# Patient Record
Sex: Female | Born: 1985 | Race: White | Hispanic: No | Marital: Married | Smoking: Never smoker
Health system: Southern US, Community
[De-identification: ages and names within clinical notes are randomized; demographics above are authoritative.]

## PROBLEM LIST (undated history)

## (undated) ENCOUNTER — Inpatient Hospital Stay (HOSPITAL_COMMUNITY): Payer: Self-pay

## (undated) DIAGNOSIS — Z789 Other specified health status: Secondary | ICD-10-CM

## (undated) HISTORY — DX: Other specified health status: Z78.9

## (undated) HISTORY — PX: NO PAST SURGERIES: SHX2092

## (undated) HISTORY — PX: BREAST SURGERY: SHX581

---

## 2012-08-02 LAB — OB RESULTS CONSOLE GC/CHLAMYDIA
Chlamydia: NEGATIVE
Gonorrhea: NEGATIVE

## 2012-08-02 LAB — OB RESULTS CONSOLE ABO/RH

## 2012-08-02 LAB — OB RESULTS CONSOLE ANTIBODY SCREEN: Antibody Screen: NEGATIVE

## 2013-02-25 ENCOUNTER — Telehealth (HOSPITAL_COMMUNITY): Payer: Self-pay | Admitting: *Deleted

## 2013-02-25 ENCOUNTER — Inpatient Hospital Stay (HOSPITAL_COMMUNITY)
Admission: AD | Admit: 2013-02-25 | Discharge: 2013-03-01 | DRG: 765 | Disposition: A | Discharge status: Home / Self Care | Payer: Commercial Managed Care - PPO | Source: Ambulatory Visit | Attending: Obstetrics and Gynecology | Admitting: Obstetrics and Gynecology

## 2013-02-25 ENCOUNTER — Encounter (HOSPITAL_COMMUNITY): Payer: Self-pay | Admitting: *Deleted

## 2013-02-25 DIAGNOSIS — O339 Maternal care for disproportion, unspecified: Secondary | ICD-10-CM | POA: Diagnosis present

## 2013-02-25 DIAGNOSIS — O411090 Infection of amniotic sac and membranes, unspecified, unspecified trimester, not applicable or unspecified: Secondary | ICD-10-CM | POA: Diagnosis present

## 2013-02-25 DIAGNOSIS — Z98891 History of uterine scar from previous surgery: Secondary | ICD-10-CM

## 2013-02-25 DIAGNOSIS — O330 Maternal care for disproportion due to deformity of maternal pelvic bones: Principal | ICD-10-CM | POA: Diagnosis present

## 2013-02-25 LAB — CBC
HCT: 33.5 % — ABNORMAL LOW (ref 36.0–46.0)
Hemoglobin: 11.1 g/dL — ABNORMAL LOW (ref 12.0–15.0)
MCHC: 33.1 g/dL (ref 30.0–36.0)
RBC: 4.08 MIL/uL (ref 3.87–5.11)

## 2013-02-25 MED ORDER — OXYTOCIN 40 UNITS IN LACTATED RINGERS INFUSION - SIMPLE MED: 62.5000 mL/h | INTRAVENOUS | Status: DC

## 2013-02-25 MED ORDER — ONDANSETRON HCL 4 MG/2ML IJ SOLN
4.0000 mg | Freq: Four times a day (QID) | INTRAMUSCULAR | Status: DC | PRN
  Administered 2013-02-26 (×2): 4 mg via INTRAVENOUS
  Filled 2013-02-25 (×2): qty 2

## 2013-02-25 MED ORDER — CITRIC ACID-SODIUM CITRATE 334-500 MG/5ML PO SOLN
30.0000 mL | ORAL | Status: DC | PRN
  Administered 2013-02-26 – 2013-02-27 (×2): 30 mL via ORAL
  Filled 2013-02-25 (×2): qty 15

## 2013-02-25 MED ORDER — OXYTOCIN BOLUS FROM INFUSION: 500.0000 mL | INTRAVENOUS | Status: DC

## 2013-02-25 MED ORDER — LACTATED RINGERS IV SOLN
INTRAVENOUS | Status: DC
  Administered 2013-02-25: 23:00:00 via INTRAVENOUS
  Administered 2013-02-26: 125 mL/h via INTRAVENOUS
  Administered 2013-02-26: 15:00:00 via INTRAVENOUS

## 2013-02-25 MED ORDER — BUTORPHANOL TARTRATE 1 MG/ML IJ SOLN
1.0000 mg | INTRAMUSCULAR | Status: DC | PRN
  Administered 2013-02-26 (×2): 1 mg via INTRAVENOUS
  Filled 2013-02-25 (×3): qty 1

## 2013-02-25 MED ORDER — IBUPROFEN 600 MG PO TABS: 600.0000 mg | ORAL_TABLET | Freq: Four times a day (QID) | ORAL | Status: DC | PRN

## 2013-02-25 MED ORDER — LIDOCAINE HCL (PF) 1 % IJ SOLN
30.0000 mL | INTRAMUSCULAR | Status: DC | PRN
  Filled 2013-02-25: qty 30

## 2013-02-25 MED ORDER — TERBUTALINE SULFATE 1 MG/ML IJ SOLN: 0.2500 mg | Freq: Once | INTRAMUSCULAR | Status: AC | PRN

## 2013-02-25 MED ORDER — OXYCODONE-ACETAMINOPHEN 5-325 MG PO TABS: 1.0000 | ORAL_TABLET | ORAL | Status: DC | PRN

## 2013-02-25 MED ORDER — OXYTOCIN 40 UNITS IN LACTATED RINGERS INFUSION - SIMPLE MED
1.0000 m[IU]/min | INTRAVENOUS | Status: DC
  Administered 2013-02-25: 1 m[IU]/min via INTRAVENOUS
  Administered 2013-02-26: 4 m[IU]/min via INTRAVENOUS
  Administered 2013-02-26: 10 m[IU]/min via INTRAVENOUS
  Administered 2013-02-26: 3 m[IU]/min via INTRAVENOUS
  Administered 2013-02-26: 13 m[IU]/min via INTRAVENOUS
  Administered 2013-02-26: 2 m[IU]/min via INTRAVENOUS
  Administered 2013-02-26: 8 m[IU]/min via INTRAVENOUS
  Administered 2013-02-26: 11 m[IU]/min via INTRAVENOUS
  Administered 2013-02-26: 9 m[IU]/min via INTRAVENOUS
  Administered 2013-02-26: 12 m[IU]/min via INTRAVENOUS
  Administered 2013-02-26: 7 m[IU]/min via INTRAVENOUS
  Administered 2013-02-26: 15 m[IU]/min via INTRAVENOUS
  Administered 2013-02-26: 5 m[IU]/min via INTRAVENOUS
  Administered 2013-02-26: 6 m[IU]/min via INTRAVENOUS
  Filled 2013-02-25: qty 1000

## 2013-02-25 MED ORDER — LACTATED RINGERS IV SOLN
500.0000 mL | INTRAVENOUS | Status: DC | PRN
  Administered 2013-02-26: 500 mL via INTRAVENOUS

## 2013-02-25 MED ORDER — ACETAMINOPHEN 325 MG PO TABS: 650.0000 mg | ORAL_TABLET | ORAL | Status: DC | PRN

## 2013-02-25 NOTE — Telephone Encounter (Signed)
Preadmission screen  
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
## 2013-02-26 ENCOUNTER — Encounter (HOSPITAL_COMMUNITY): Payer: Self-pay | Admitting: Anesthesiology

## 2013-02-26 LAB — RPR: RPR Ser Ql: NONREACTIVE

## 2013-02-26 MED ORDER — EPHEDRINE 5 MG/ML INJ
10.0000 mg | INTRAVENOUS | Status: DC | PRN
  Filled 2013-02-26: qty 4

## 2013-02-26 MED ORDER — FENTANYL 2.5 MCG/ML BUPIVACAINE 1/10 % EPIDURAL INFUSION (WH - ANES)
14.0000 mL/h | INTRAMUSCULAR | Status: DC | PRN
  Administered 2013-02-26 – 2013-02-27 (×2): 14 mL/h via EPIDURAL
  Filled 2013-02-26 (×4): qty 125

## 2013-02-26 MED ORDER — PROMETHAZINE HCL 25 MG/ML IJ SOLN: 12.5000 mg | Freq: Once | INTRAMUSCULAR | Status: DC

## 2013-02-26 MED ORDER — FENTANYL 2.5 MCG/ML BUPIVACAINE 1/10 % EPIDURAL INFUSION (WH - ANES)
INTRAMUSCULAR | Status: DC | PRN
  Administered 2013-02-26: 14 mL/h via EPIDURAL

## 2013-02-26 MED ORDER — LACTATED RINGERS IV SOLN
500.0000 mL | Freq: Once | INTRAVENOUS | Status: AC
  Administered 2013-02-26: 500 mL via INTRAVENOUS

## 2013-02-26 MED ORDER — LIDOCAINE HCL (PF) 1 % IJ SOLN
INTRAMUSCULAR | Status: DC | PRN
  Administered 2013-02-26 (×2): 8 mL

## 2013-02-26 MED ORDER — PHENYLEPHRINE 40 MCG/ML (10ML) SYRINGE FOR IV PUSH (FOR BLOOD PRESSURE SUPPORT)
80.0000 ug | PREFILLED_SYRINGE | INTRAVENOUS | Status: DC | PRN
  Filled 2013-02-26: qty 5

## 2013-02-26 MED ORDER — DIPHENHYDRAMINE HCL 50 MG/ML IJ SOLN: 12.5000 mg | INTRAMUSCULAR | Status: DC | PRN

## 2013-02-26 MED ORDER — PHENYLEPHRINE 40 MCG/ML (10ML) SYRINGE FOR IV PUSH (FOR BLOOD PRESSURE SUPPORT): 80.0000 ug | PREFILLED_SYRINGE | INTRAVENOUS | Status: DC | PRN

## 2013-02-26 MED ORDER — EPHEDRINE 5 MG/ML INJ: 10.0000 mg | INTRAVENOUS | Status: DC | PRN

## 2013-02-26 NOTE — Progress Notes (Signed)
Patient ID: Julia Bell, female   DOB: [birth year], 27 y.o.   MRN: [id number] Pitocin is now at 13 mu/ minute . Her contractions are 2 to 5 minutes apart. Per the RN the cervix was 4 cm at 2PM and 5 cm at 4 PM. She was 6 cm at 5 PM and now she is 6 cm The vertex is at - 2/3 station.

## 2013-02-26 NOTE — Progress Notes (Signed)
Patient ID: Julia Bell, female   DOB: [birth year], 27 y.o.   MRN: [id number] Pitocin at 10 mu/ minute and the contractions are q 2-3 minutes. At 11 AM per the RN exam the cervix was 3 cm 80 % effaced and the vertex was at -1/-2 station

## 2013-02-26 NOTE — Anesthesia Procedure Notes (Signed)
Epidural Patient location during procedure: OB Start time: 02/26/2013 6:49 AM End time: 02/26/2013 6:53 AM  Staffing Anesthesiologist: Sandrea Hughs Performed by: anesthesiologist   Preanesthetic Checklist Completed: patient identified, site marked, surgical consent, pre-op evaluation, timeout performed, IV checked, risks and benefits discussed and monitors and equipment checked  Epidural Patient position: sitting Prep: site prepped and draped and DuraPrep Patient monitoring: continuous pulse ox and blood pressure Approach: midline Injection technique: LOR air  Needle:  Needle type: Tuohy  Needle gauge: 17 G Needle length: 9 cm and 9 Needle insertion depth: 4 cm Catheter type: closed end flexible Catheter size: 19 Gauge Catheter at skin depth: 9 cm Test dose: negative and Other  Assessment Sensory level: T9 Events: blood not aspirated, injection not painful, no injection resistance, negative IV test and no paresthesia  Additional Notes Reason for block:procedure for pain

## 2013-02-26 NOTE — Progress Notes (Signed)
Patient ID: Julia Bell, female   DOB: 1986/01/12, 27 y.o.   MRN: [id number] Pitocin is at 18 mu/ minute and the contractions are q 2 minutes. The cervix is 8-9 cm 100% effaced and the vertex is at -1/0 station.

## 2013-02-26 NOTE — Progress Notes (Signed)
Called dr Senaida Ores, informed of uc pattern, fhr tracing, sve, pitocin discontinued, to leave pitoicn off at this time and see what pt does on her own

## 2013-02-26 NOTE — Progress Notes (Signed)
Pt contractions continued throughout night with SROM at 300am.  Received stadol x 2, and then epidural at 0625am  Subjective: Pt received epidural and is comfortable.    Objective: BP 132/79  Pulse 79  Temp(Src) 98.1 F (36.7 C) (Oral)  Resp 20  Ht 5' 2.5" (1.588 m)  Wt 72.576 kg (160 lb)  BMI 28.78 kg/m2  SpO2 99%  LMP 05/12/2012      FHT:  FHR: 145 bpm, variability: moderate,  accelerations:  Present,  decelerations:  Absent UC:   regular, every 2-3 minutes SVE:   Dilation: 2 Effacement (%): 80 Station: -1 Exam by:: Dr. Senaida Ores  IUPC placed  Labs: Lab Results  Component Value Date   WBC 11.3* 02/25/2013   HGB 11.1* 02/25/2013   HCT 33.5* 02/25/2013   MCV 82.1 02/25/2013   PLT 206 02/25/2013    Assessment / Plan: Pt still likely in latent phase labor. Contractions do not look adequate so will continue to increase pitocin. Oliver Pila 02/26/2013, 8:24 AM

## 2013-02-26 NOTE — Progress Notes (Signed)
Patient ID: Julia Bell, female   DOB: 1986-09-04, 27 y.o.   MRN: [id number] Pt became fully dilated at 10:30 PM and has been pushing since then. I will observe her progress.

## 2013-02-26 NOTE — Anesthesia Preprocedure Evaluation (Signed)
Anesthesia Evaluation  Patient identified by MRN, date of birth, ID band Patient awake    Reviewed: Allergy & Precautions, H&P , NPO status , Patient's Chart, lab work & pertinent test results  Airway Mallampati: II TM Distance: >3 FB Neck ROM: full    Dental no notable dental hx.    Pulmonary neg pulmonary ROS,    Pulmonary exam normal       Cardiovascular negative cardio ROS      Neuro/Psych negative neurological ROS  negative psych ROS   GI/Hepatic negative GI ROS, Neg liver ROS,   Endo/Other  negative endocrine ROS  Renal/GU negative Renal ROS     Musculoskeletal negative musculoskeletal ROS (+)   Abdominal Normal abdominal exam  (+)   Peds negative pediatric ROS (+)  Hematology negative hematology ROS (+)   Anesthesia Other Findings   Reproductive/Obstetrics (+) Pregnancy                           Anesthesia Physical Anesthesia Plan  ASA: II  Anesthesia Plan: Epidural   Post-op Pain Management:    Induction:   Airway Management Planned:   Additional Equipment:   Intra-op Plan:   Post-operative Plan:   Informed Consent: I have reviewed the patients History and Physical, chart, labs and discussed the procedure including the risks, benefits and alternatives for the proposed anesthesia with the patient or authorized representative who has indicated his/her understanding and acceptance.     Plan Discussed with:   Anesthesia Plan Comments:         Anesthesia Quick Evaluation

## 2013-02-27 ENCOUNTER — Encounter (HOSPITAL_COMMUNITY): Payer: Self-pay | Admitting: Anesthesiology

## 2013-02-27 ENCOUNTER — Encounter (HOSPITAL_COMMUNITY): Payer: Self-pay | Admitting: *Deleted

## 2013-02-27 ENCOUNTER — Inpatient Hospital Stay (HOSPITAL_COMMUNITY): Payer: Commercial Managed Care - PPO | Admitting: Anesthesiology

## 2013-02-27 ENCOUNTER — Encounter (HOSPITAL_COMMUNITY)
Admission: AD | Disposition: A | Discharge status: Home / Self Care | Payer: Self-pay | Source: Ambulatory Visit | Attending: Obstetrics and Gynecology

## 2013-02-27 LAB — ABO/RH: ABO/RH(D): O POS

## 2013-02-27 LAB — CBC
HCT: 25.4 % — ABNORMAL LOW (ref 36.0–46.0)
Hemoglobin: 8.3 g/dL — ABNORMAL LOW (ref 12.0–15.0)
MCHC: 32.7 g/dL (ref 30.0–36.0)

## 2013-02-27 SURGERY — Surgical Case
Anesthesia: Epidural | Site: Abdomen | Wound class: Clean Contaminated

## 2013-02-27 MED ORDER — LACTATED RINGERS IV SOLN
INTRAVENOUS | Status: DC | PRN
  Administered 2013-02-27 (×2): via INTRAVENOUS

## 2013-02-27 MED ORDER — PHENYLEPHRINE 40 MCG/ML (10ML) SYRINGE FOR IV PUSH (FOR BLOOD PRESSURE SUPPORT)
PREFILLED_SYRINGE | INTRAVENOUS | Status: AC
  Filled 2013-02-27: qty 5

## 2013-02-27 MED ORDER — KETOROLAC TROMETHAMINE 30 MG/ML IJ SOLN: 15.0000 mg | Freq: Once | INTRAMUSCULAR | Status: DC | PRN

## 2013-02-27 MED ORDER — TETANUS-DIPHTH-ACELL PERTUSSIS 5-2.5-18.5 LF-MCG/0.5 IM SUSP: 0.5000 mL | Freq: Once | INTRAMUSCULAR | Status: DC

## 2013-02-27 MED ORDER — NALBUPHINE SYRINGE 5 MG/0.5 ML
5.0000 mg | INJECTION | INTRAMUSCULAR | Status: DC | PRN
  Filled 2013-02-27: qty 1

## 2013-02-27 MED ORDER — WITCH HAZEL-GLYCERIN EX PADS: 1.0000 "application " | MEDICATED_PAD | CUTANEOUS | Status: DC | PRN

## 2013-02-27 MED ORDER — SODIUM BICARBONATE 8.4 % IV SOLN
INTRAVENOUS | Status: AC
  Filled 2013-02-27: qty 50

## 2013-02-27 MED ORDER — LACTATED RINGERS IV SOLN
INTRAVENOUS | Status: AC
  Administered 2013-02-27 (×2): via INTRAVENOUS

## 2013-02-27 MED ORDER — SENNOSIDES-DOCUSATE SODIUM 8.6-50 MG PO TABS
2.0000 | ORAL_TABLET | Freq: Every day | ORAL | Status: DC
  Administered 2013-02-28: 2 via ORAL

## 2013-02-27 MED ORDER — METOCLOPRAMIDE HCL 5 MG/ML IJ SOLN: 10.0000 mg | Freq: Three times a day (TID) | INTRAMUSCULAR | Status: DC | PRN

## 2013-02-27 MED ORDER — ZOLPIDEM TARTRATE 5 MG PO TABS: 5.0000 mg | ORAL_TABLET | Freq: Every evening | ORAL | Status: DC | PRN

## 2013-02-27 MED ORDER — SCOPOLAMINE 1 MG/3DAYS TD PT72
MEDICATED_PATCH | TRANSDERMAL | Status: AC
  Filled 2013-02-27: qty 1

## 2013-02-27 MED ORDER — LIDOCAINE HCL (CARDIAC) 20 MG/ML IV SOLN
INTRAVENOUS | Status: AC
  Filled 2013-02-27: qty 5

## 2013-02-27 MED ORDER — NALOXONE HCL 0.4 MG/ML IJ SOLN: 0.4000 mg | INTRAMUSCULAR | Status: DC | PRN

## 2013-02-27 MED ORDER — EPHEDRINE 5 MG/ML INJ
INTRAVENOUS | Status: AC
  Filled 2013-02-27: qty 10

## 2013-02-27 MED ORDER — MEPERIDINE HCL 25 MG/ML IJ SOLN: 6.2500 mg | INTRAMUSCULAR | Status: DC | PRN

## 2013-02-27 MED ORDER — MEPERIDINE HCL 25 MG/ML IJ SOLN
INTRAMUSCULAR | Status: DC | PRN
  Administered 2013-02-27: 25 mg via INTRAVENOUS

## 2013-02-27 MED ORDER — DIBUCAINE 1 % RE OINT: 1.0000 "application " | TOPICAL_OINTMENT | RECTAL | Status: DC | PRN

## 2013-02-27 MED ORDER — MEASLES, MUMPS & RUBELLA VAC ~~LOC~~ INJ
0.5000 mL | INJECTION | Freq: Once | SUBCUTANEOUS | Status: AC
  Administered 2013-03-01: 0.5 mL via SUBCUTANEOUS
  Filled 2013-02-27 (×2): qty 0.5

## 2013-02-27 MED ORDER — MORPHINE SULFATE (PF) 0.5 MG/ML IJ SOLN
INTRAMUSCULAR | Status: DC | PRN
  Administered 2013-02-27: 2000 ug via INTRAVENOUS
  Administered 2013-02-27: 3000 ug via EPIDURAL

## 2013-02-27 MED ORDER — HYDROMORPHONE HCL PF 1 MG/ML IJ SOLN
INTRAMUSCULAR | Status: AC
  Filled 2013-02-27: qty 1

## 2013-02-27 MED ORDER — DIPHENHYDRAMINE HCL 50 MG/ML IJ SOLN: 12.5000 mg | INTRAMUSCULAR | Status: DC | PRN

## 2013-02-27 MED ORDER — DIPHENHYDRAMINE HCL 50 MG/ML IJ SOLN: 25.0000 mg | INTRAMUSCULAR | Status: DC | PRN

## 2013-02-27 MED ORDER — SIMETHICONE 80 MG PO CHEW
80.0000 mg | CHEWABLE_TABLET | Freq: Three times a day (TID) | ORAL | Status: DC
  Administered 2013-02-27 – 2013-03-01 (×7): 80 mg via ORAL

## 2013-02-27 MED ORDER — ONDANSETRON HCL 4 MG/2ML IJ SOLN
INTRAMUSCULAR | Status: AC
  Filled 2013-02-27: qty 2

## 2013-02-27 MED ORDER — MEPERIDINE HCL 25 MG/ML IJ SOLN
INTRAMUSCULAR | Status: AC
  Filled 2013-02-27: qty 1

## 2013-02-27 MED ORDER — SCOPOLAMINE 1 MG/3DAYS TD PT72
1.0000 | MEDICATED_PATCH | Freq: Once | TRANSDERMAL | Status: DC
  Administered 2013-02-27: 1.5 mg via TRANSDERMAL

## 2013-02-27 MED ORDER — PHENYLEPHRINE HCL 10 MG/ML IJ SOLN
INTRAMUSCULAR | Status: DC | PRN
  Administered 2013-02-27: 80 ug via INTRAVENOUS

## 2013-02-27 MED ORDER — SODIUM BICARBONATE 8.4 % IV SOLN
INTRAVENOUS | Status: DC | PRN
  Administered 2013-02-27: 5 mL via EPIDURAL

## 2013-02-27 MED ORDER — KETOROLAC TROMETHAMINE 60 MG/2ML IM SOLN
INTRAMUSCULAR | Status: AC
  Filled 2013-02-27: qty 2

## 2013-02-27 MED ORDER — CEFAZOLIN SODIUM 1-5 GM-% IV SOLN
1.0000 g | Freq: Three times a day (TID) | INTRAVENOUS | Status: AC
  Administered 2013-02-27 (×3): 1 g via INTRAVENOUS
  Filled 2013-02-27 (×3): qty 50

## 2013-02-27 MED ORDER — CEFAZOLIN SODIUM-DEXTROSE 2-3 GM-% IV SOLR
2.0000 g | Freq: Once | INTRAVENOUS | Status: AC
  Administered 2013-02-27: 2 g via INTRAVENOUS
  Filled 2013-02-27: qty 50

## 2013-02-27 MED ORDER — OXYTOCIN 40 UNITS IN LACTATED RINGERS INFUSION - SIMPLE MED: 62.5000 mL/h | INTRAVENOUS | Status: AC

## 2013-02-27 MED ORDER — MENTHOL 3 MG MT LOZG: 1.0000 | LOZENGE | OROMUCOSAL | Status: DC | PRN

## 2013-02-27 MED ORDER — ONDANSETRON HCL 4 MG PO TABS: 4.0000 mg | ORAL_TABLET | ORAL | Status: DC | PRN

## 2013-02-27 MED ORDER — ONDANSETRON HCL 4 MG/2ML IJ SOLN: 4.0000 mg | INTRAMUSCULAR | Status: DC | PRN

## 2013-02-27 MED ORDER — HYDROMORPHONE HCL PF 1 MG/ML IJ SOLN
0.2500 mg | INTRAMUSCULAR | Status: DC | PRN
  Administered 2013-02-27 (×2): 0.5 mg via INTRAVENOUS

## 2013-02-27 MED ORDER — SIMETHICONE 80 MG PO CHEW
80.0000 mg | CHEWABLE_TABLET | ORAL | Status: DC | PRN
  Administered 2013-02-28: 80 mg via ORAL

## 2013-02-27 MED ORDER — KETOROLAC TROMETHAMINE 60 MG/2ML IM SOLN
60.0000 mg | Freq: Once | INTRAMUSCULAR | Status: AC | PRN
  Administered 2013-02-27: 60 mg via INTRAMUSCULAR

## 2013-02-27 MED ORDER — DIPHENHYDRAMINE HCL 25 MG PO CAPS: 25.0000 mg | ORAL_CAPSULE | Freq: Four times a day (QID) | ORAL | Status: DC | PRN

## 2013-02-27 MED ORDER — SODIUM CHLORIDE 0.9 % IJ SOLN
3.0000 mL | INTRAMUSCULAR | Status: DC | PRN
  Administered 2013-02-27: 3 mL via INTRAVENOUS

## 2013-02-27 MED ORDER — PRENATAL MULTIVITAMIN CH
1.0000 | ORAL_TABLET | Freq: Every day | ORAL | Status: DC
  Administered 2013-02-27: 1 via ORAL
  Filled 2013-02-27: qty 1

## 2013-02-27 MED ORDER — OXYCODONE-ACETAMINOPHEN 5-325 MG PO TABS
1.0000 | ORAL_TABLET | ORAL | Status: DC | PRN
  Administered 2013-02-28 – 2013-03-01 (×4): 1 via ORAL
  Filled 2013-02-27 (×4): qty 1

## 2013-02-27 MED ORDER — OXYTOCIN 10 UNIT/ML IJ SOLN
40.0000 [IU] | INTRAVENOUS | Status: DC | PRN
  Administered 2013-02-27: 40 [IU] via INTRAVENOUS

## 2013-02-27 MED ORDER — OXYTOCIN 10 UNIT/ML IJ SOLN
INTRAMUSCULAR | Status: AC
  Filled 2013-02-27: qty 4

## 2013-02-27 MED ORDER — DIPHENHYDRAMINE HCL 25 MG PO CAPS: 25.0000 mg | ORAL_CAPSULE | ORAL | Status: DC | PRN

## 2013-02-27 MED ORDER — 0.9 % SODIUM CHLORIDE (POUR BTL) OPTIME
TOPICAL | Status: DC | PRN
  Administered 2013-02-27: 1000 mL

## 2013-02-27 MED ORDER — ONDANSETRON HCL 4 MG/2ML IJ SOLN
INTRAMUSCULAR | Status: DC | PRN
  Administered 2013-02-27: 4 mg via INTRAVENOUS

## 2013-02-27 MED ORDER — NALOXONE HCL 1 MG/ML IJ SOLN
1.0000 ug/kg/h | INTRAVENOUS | Status: DC | PRN
  Filled 2013-02-27: qty 2

## 2013-02-27 MED ORDER — ONDANSETRON HCL 4 MG/2ML IJ SOLN: 4.0000 mg | Freq: Three times a day (TID) | INTRAMUSCULAR | Status: DC | PRN

## 2013-02-27 MED ORDER — MORPHINE SULFATE 0.5 MG/ML IJ SOLN
INTRAMUSCULAR | Status: AC
  Filled 2013-02-27: qty 10

## 2013-02-27 MED ORDER — NALBUPHINE SYRINGE 5 MG/0.5 ML
5.0000 mg | INJECTION | INTRAMUSCULAR | Status: DC | PRN
  Administered 2013-02-27: 10 mg via INTRAVENOUS
  Filled 2013-02-27 (×2): qty 1

## 2013-02-27 MED ORDER — KETOROLAC TROMETHAMINE 30 MG/ML IJ SOLN
30.0000 mg | Freq: Four times a day (QID) | INTRAMUSCULAR | Status: AC | PRN
  Filled 2013-02-27: qty 1

## 2013-02-27 MED ORDER — KETOROLAC TROMETHAMINE 30 MG/ML IJ SOLN: 30.0000 mg | Freq: Four times a day (QID) | INTRAMUSCULAR | Status: AC | PRN

## 2013-02-27 MED ORDER — LANOLIN HYDROUS EX OINT: 1.0000 "application " | TOPICAL_OINTMENT | CUTANEOUS | Status: DC | PRN

## 2013-02-27 MED ORDER — IBUPROFEN 600 MG PO TABS
600.0000 mg | ORAL_TABLET | Freq: Four times a day (QID) | ORAL | Status: DC
  Administered 2013-02-27 – 2013-03-01 (×7): 600 mg via ORAL
  Filled 2013-02-27 (×8): qty 1

## 2013-02-27 MED ORDER — PROMETHAZINE HCL 25 MG/ML IJ SOLN: 6.2500 mg | INTRAMUSCULAR | Status: DC | PRN

## 2013-02-27 SURGICAL SUPPLY — 37 items
CLOTH BEACON ORANGE TIMEOUT ST (SAFETY) ×2 IMPLANT
CONTAINER PREFILL 10% NBF 15ML (MISCELLANEOUS) IMPLANT
DRAPE LG THREE QUARTER DISP (DRAPES) ×2 IMPLANT
DRSG OPSITE POSTOP 4X10 (GAUZE/BANDAGES/DRESSINGS) ×2 IMPLANT
DRSG VASELINE 3X18 (GAUZE/BANDAGES/DRESSINGS) ×2 IMPLANT
DURAPREP 26ML APPLICATOR (WOUND CARE) ×2 IMPLANT
ELECT REM PT RETURN 9FT ADLT (ELECTROSURGICAL) ×2
ELECTRODE REM PT RTRN 9FT ADLT (ELECTROSURGICAL) ×1 IMPLANT
EXTRACTOR VACUUM KIWI (MISCELLANEOUS) IMPLANT
EXTRACTOR VACUUM M CUP 4 TUBE (SUCTIONS) IMPLANT
GAUZE VASELINE 3X9 (GAUZE/BANDAGES/DRESSINGS) ×2 IMPLANT
GLOVE BIO SURGEON STRL SZ7.5 (GLOVE) ×2 IMPLANT
GLOVE INDICATOR 7.0 STRL GRN (GLOVE) ×2 IMPLANT
GLOVE SKINSENSE NS SZ7.5 (GLOVE) ×1
GLOVE SKINSENSE NS SZ8.0 LF (GLOVE) ×1
GLOVE SKINSENSE STRL SZ7.5 (GLOVE) ×1 IMPLANT
GLOVE SKINSENSE STRL SZ8.0 LF (GLOVE) ×1 IMPLANT
GOWN PREVENTION PLUS XLARGE (GOWN DISPOSABLE) ×2 IMPLANT
GOWN STRL REIN XL XLG (GOWN DISPOSABLE) ×4 IMPLANT
KIT ABG SYR 3ML LUER SLIP (SYRINGE) IMPLANT
NEEDLE HYPO 25X5/8 SAFETYGLIDE (NEEDLE) IMPLANT
NS IRRIG 1000ML POUR BTL (IV SOLUTION) ×2 IMPLANT
PACK C SECTION WH (CUSTOM PROCEDURE TRAY) ×2 IMPLANT
PAD OB MATERNITY 4.3X12.25 (PERSONAL CARE ITEMS) ×2 IMPLANT
RTRCTR C-SECT PINK 25CM LRG (MISCELLANEOUS) ×2 IMPLANT
SLEEVE SCD COMPRESS KNEE MED (MISCELLANEOUS) ×2 IMPLANT
STAPLER VISISTAT 35W (STAPLE) ×2 IMPLANT
SUT PLAIN 0 NONE (SUTURE) IMPLANT
SUT VIC AB 0 CT1 36 (SUTURE) ×12 IMPLANT
SUT VIC AB 3-0 CTX 36 (SUTURE) ×2 IMPLANT
SUT VIC AB 3-0 SH 27 (SUTURE)
SUT VIC AB 3-0 SH 27X BRD (SUTURE) IMPLANT
SUT VIC AB 4-0 KS 27 (SUTURE) IMPLANT
SUT VICRYL 0 TIES 12 18 (SUTURE) IMPLANT
TOWEL OR 17X24 6PK STRL BLUE (TOWEL DISPOSABLE) ×6 IMPLANT
TRAY FOLEY CATH 14FR (SET/KITS/TRAYS/PACK) IMPLANT
WATER STERILE IRR 1000ML POUR (IV SOLUTION) IMPLANT

## 2013-02-27 NOTE — Anesthesia Postprocedure Evaluation (Signed)
Anesthesia Post Note  Patient: Julia Bell  Procedure(s) Performed: Procedure(s) (LRB): CESAREAN SECTION (N/A)  Anesthesia type: Epidural  Patient location: Mother/Baby  Post pain: Pain level controlled  Post assessment: Post-op Vital signs reviewed  Last Vitals:  Filed Vitals:   02/27/13 1210  BP: 128/77  Pulse: 77  Temp: 37.1 C  Resp: 18    Post vital signs: Reviewed  Level of consciousness:alert  Complications: No apparent anesthesia complications  

## 2013-02-27 NOTE — Progress Notes (Signed)
Patient ID: Julia Bell, female   DOB: Oct 24, 1986, 27 y.o.   MRN: [id number] After I left the room the pt told the nurse that she wanted to proceed with a C section. I spoke tyo the pt and she indeed does not want to keep pushing. Will proceed with c section for relative CPD and probable chorioamnionitis

## 2013-02-27 NOTE — Op Note (Signed)
Operative note on Julia Bell  Date of the operation 02/27/2013  Preoperative diagnosis: relative cephalopelvic disproportion, probable chorioamnionitis  Postoperative diagnosis: Same  Operation: Low transverse cervical C-section  Operator: Azalia Neuberger  Anesthesia: Epidural  Dr. Arby Barrette  The patient was brought to the operating room after her epidural was boosted and she was placed on the operating room table in the left lateral tilt position. After anesthesia was adequate the abdomen was prepped by the circulating nurse with DuraPrep. A timeout was done. The Foley catheter which was indwelling was draining slightly bloody urine. After a three-minute dry of the DuraPrep the abdomen was draped as a sterile field.A transverse incision was made through the skin subcutaneous tissue and fascia just above the pubis after anesthesia was found to be adequate. I entered the peritoneal cavity, after I separated the fascia from the rectus muscle superiorly and inferiorly. The peritoneal incision was enlarged and an Alexis retractor was placed into the peritoneal cavity to aid in exposure of the lower uterine segment. A superficial transverse incision was made through the myometrium in the lower uterine segment. I entered the  amniotic sac with my finger and enlarged the incision by pulling superiorly and inferiorly. The vertex was wedged into the pelvis and I asked the RN from labor and delivery to push up on the baby's head.This allowed me to deliver the baby's head atraumatically and after delivering the head the body came out quickly. I suction the baby's nose and mouth, the scrub nurse clamped and cut the cord, and I took the baby to the waiting neonatologist Dr. Mikle Bosworth. She assigned Apgars to the female infant of 7 at one and 9 at 5 minutes. Pitocin was begun and the placenta was removed. I teased out a large amount of membranes and palpated the fundus of the uterus to make sure there was no remaining products  of conception. The incision was seen to be an inch above the bladder and I closed the uterine incision transversely with 2 running sutures of 0 Vicryl locking the first layer and nonlocking of the second. The uterus tubes and ovaries were inspected when I had exteriorized the uterus shortly after delivery and they were normal. Both gutters were blotted free of blood, liberal irrigation confirmed hemostasis, the retractor was removed, and the abdominal wall was closed in layers. 0 Vicryl interrupted sutures were used to close the peritoneum and rectus muscle in one layer, 2 running sutures of 0 Vicryl were used for the fascia, a running 3-0 Vicryl for the subcutaneous tissue, and staples were  used for the skin.  Estimated blood loss was about cc sponge and needle counts were correct, a dressing was applied, and the patient was returned to recovery in satisfactory condition

## 2013-02-27 NOTE — Anesthesia Postprocedure Evaluation (Signed)
Anesthesia Post Note  Patient: Julia Bell  Procedure(s) Performed: Procedure(s) (LRB): CESAREAN SECTION (N/A)  Anesthesia type: Epidural  Patient location: Mother/Baby  Post pain: Pain level controlled  Post assessment: Post-op Vital signs reviewed  Last Vitals:  Filed Vitals:   02/27/13 1210  BP: 128/77  Pulse: 77  Temp: 37.1 C  Resp: 18    Post vital signs: Reviewed  Level of consciousness:alert  Complications: No apparent anesthesia complications

## 2013-02-27 NOTE — Anesthesia Postprocedure Evaluation (Signed)
Anesthesia Post Note  Patient: Julia Bell  Procedure(s) Performed: Procedure(s) (LRB): CESAREAN SECTION (N/A)  Anesthesia type: Epidural  Patient location: PACU  Post pain: Pain level controlled  Post assessment: Post-op Vital signs reviewed  Last Vitals:  Filed Vitals:   02/27/13 0151  BP:   Pulse:   Temp: 37 C  Resp:     Post vital signs: Reviewed  Level of consciousness: awake  Complications: No apparent anesthesia complications

## 2013-02-27 NOTE — Transfer of Care (Signed)
Immediate Anesthesia Transfer of Care Note  Patient: Julia Bell  Procedure(s) Performed: Procedure(s) with comments: CESAREAN SECTION (N/A) - Primary Cesarean Section Delivery Baby Girl @ 0101   Apgars 7/9  Patient Location: PACU  Anesthesia Type:Epidural  Level of Consciousness: awake, alert  and oriented  Airway & Oxygen Therapy: Patient Spontanous Breathing  Post-op Assessment: Report given to PACU RN and Post -op Vital signs reviewed and stable  Post vital signs: Reviewed and stable  Complications: No apparent anesthesia complications

## 2013-02-27 NOTE — Progress Notes (Signed)
Patient ID: Julia Bell, female   DOB: September 23, 1986, 27 y.o.   MRN: [id number] Dos afebrile doing well

## 2013-02-27 NOTE — Progress Notes (Signed)
Pt requesting to see Dr. Ambrose Mantle to discuss plan of care, Dr. Ambrose Mantle at bedside pt requesting to precede with c-section, Dr. Ambrose Mantle discussing risks and benefits of c-section, pt verbalizes understanding and consents to c-section, FOB at bedside

## 2013-02-27 NOTE — Progress Notes (Signed)
Patient ID: Julia Bell, female   DOB: 13-May-1986, 27 y.o.   MRN: [id number] Pt has been pushing for 1 and 1/2 hours. Temp = 100.3 degrees. I cannot see any of the caput with pushing. Will  Reevaluate in 30 minutes.

## 2013-02-28 ENCOUNTER — Encounter (HOSPITAL_COMMUNITY): Payer: Self-pay | Admitting: Obstetrics and Gynecology

## 2013-02-28 ENCOUNTER — Inpatient Hospital Stay (HOSPITAL_COMMUNITY): Admission: RE | Admit: 2013-02-28 | Payer: Commercial Managed Care - PPO | Source: Ambulatory Visit

## 2013-02-28 NOTE — Progress Notes (Signed)
Patient ID: Julia Bell, female   DOB: Aug 10, 1986, 27 y.o.   MRN: [id number] #1 afebrile The antibiotics have been d/c'ed. She is tolerating a diet, passing flatus, voiding well and ambulating well. HGB 11.1 to 8.3.

## 2013-03-01 MED ORDER — OXYCODONE-ACETAMINOPHEN 5-325 MG PO TABS: 1.0000 | ORAL_TABLET | Freq: Four times a day (QID) | ORAL | Status: DC | PRN

## 2013-03-01 MED ORDER — IBUPROFEN 600 MG PO TABS: 600.0000 mg | ORAL_TABLET | Freq: Four times a day (QID) | ORAL | Status: DC | PRN

## 2013-03-01 NOTE — Progress Notes (Signed)
Patient ID: Julia Bell, female   DOB: 04-Oct-1986, 27 y.o.   MRN: [id number] #2 afebrile BP normal no problems for d/c
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
## 2014-10-13 ENCOUNTER — Encounter (HOSPITAL_COMMUNITY): Payer: Self-pay | Admitting: Obstetrics and Gynecology

## 2015-06-07 ENCOUNTER — Inpatient Hospital Stay (HOSPITAL_COMMUNITY)
Admission: AD | Admit: 2015-06-07 | Discharge: 2015-06-07 | Disposition: A | Discharge status: Home / Self Care | Payer: PRIVATE HEALTH INSURANCE | Source: Ambulatory Visit | Attending: Obstetrics and Gynecology | Admitting: Obstetrics and Gynecology

## 2015-06-07 ENCOUNTER — Inpatient Hospital Stay (HOSPITAL_COMMUNITY): Payer: Commercial Managed Care - PPO

## 2015-06-07 ENCOUNTER — Encounter (HOSPITAL_COMMUNITY): Payer: Self-pay | Admitting: *Deleted

## 2015-06-07 DIAGNOSIS — O034 Incomplete spontaneous abortion without complication: Secondary | ICD-10-CM | POA: Insufficient documentation

## 2015-06-07 DIAGNOSIS — Z3A09 9 weeks gestation of pregnancy: Secondary | ICD-10-CM | POA: Insufficient documentation

## 2015-06-07 DIAGNOSIS — O209 Hemorrhage in early pregnancy, unspecified: Secondary | ICD-10-CM

## 2015-06-07 LAB — URINALYSIS, ROUTINE W REFLEX MICROSCOPIC
BILIRUBIN URINE: NEGATIVE
Glucose, UA: NEGATIVE mg/dL
Ketones, ur: NEGATIVE mg/dL
LEUKOCYTES UA: NEGATIVE
NITRITE: NEGATIVE
Protein, ur: NEGATIVE mg/dL
Specific Gravity, Urine: 1.02 (ref 1.005–1.030)
UROBILINOGEN UA: 0.2 mg/dL (ref 0.0–1.0)
pH: 7 (ref 5.0–8.0)

## 2015-06-07 LAB — URINE MICROSCOPIC-ADD ON

## 2015-06-07 LAB — POCT PREGNANCY, URINE: Preg Test, Ur: POSITIVE — AB

## 2015-06-07 NOTE — MAU Provider Note (Signed)
History     CSN: [id number]  Arrival date and time: 06/07/15 4098   None     Chief Complaint  Patient presents with  . Vaginal Bleeding   HPI 29 y.o. G2P1001 at [redacted]w[redacted]d w/ vaginal bleeding x 1 day. No pain. Last intercourse 3 days ago.   Past Medical History  Diagnosis Date  . Medical history non-contributory     Past Surgical History  Procedure Laterality Date  . No past surgeries    . Cesarean section N/A 02/27/2013    Procedure: CESAREAN SECTION;  Surgeon: Bing Plume, MD;  Location: [facility];  Service: Obstetrics;  Laterality: N/A;  Primary Cesarean Section Delivery Baby Girl @ 0101   Apgars 7/9    Family History  Problem Relation Age of Onset  . Diabetes Maternal Grandfather   . Heart attack Maternal Grandfather     History  Substance Use Topics  . Smoking status: Never Smoker   . Smokeless tobacco: Never Used  . Alcohol Use: No    Allergies: No Known Allergies  No prescriptions prior to admission    Review of Systems  Constitutional: Negative.   Respiratory: Negative.   Cardiovascular: Negative.   Gastrointestinal: Negative for nausea, vomiting, abdominal pain, diarrhea and constipation.  Genitourinary: Negative for dysuria, urgency, frequency, hematuria and flank pain.       + vaginal bleeding   Musculoskeletal: Negative.   Neurological: Negative.   Psychiatric/Behavioral: Negative.    Physical Exam   Blood pressure 122/67, pulse 73, temperature 97.8 F (36.6 C), temperature source Oral, resp. rate 20, last menstrual period 04/03/2015, unknown if currently breastfeeding.  Physical Exam  Nursing note and vitals reviewed. Constitutional: She is oriented to person, place, and time. She appears well-developed and well-nourished. No distress.  Cardiovascular: Normal rate.   Respiratory: Effort normal.  GI: Soft. She exhibits no mass. There is no tenderness. There is no rebound and no guarding.  Genitourinary: There is no rash, tenderness or lesion  on the right labia. There is no rash, tenderness or lesion on the left labia. Uterus is enlarged (c/w dates). Uterus is not tender. Cervix exhibits no motion tenderness, no discharge and no friability. Right adnexum displays no mass, no tenderness and no fullness. Left adnexum displays no mass, no tenderness and no fullness. There is bleeding (small) in the vagina. No vaginal discharge found.  Cervix closed   Musculoskeletal: Normal range of motion.  Neurological: She is alert and oriented to person, place, and time.  Skin: Skin is warm and dry.  Psychiatric: She has a normal mood and affect.    MAU Course  Procedures  Results for orders placed or performed during the hospital encounter of 06/07/15 (from the past 24 hour(s))  Urinalysis, Routine w reflex microscopic (not at [facility])     Status: Abnormal   Collection Time: 06/07/15  7:10 AM  Result Value Ref Range   Color, Urine YELLOW YELLOW   APPearance CLEAR CLEAR   Specific Gravity, Urine 1.020 1.005 - 1.030   pH 7.0 5.0 - 8.0   Glucose, UA NEGATIVE NEGATIVE mg/dL   Hgb urine dipstick LARGE (A) NEGATIVE   Bilirubin Urine NEGATIVE NEGATIVE   Ketones, ur NEGATIVE NEGATIVE mg/dL   Protein, ur NEGATIVE NEGATIVE mg/dL   Urobilinogen, UA 0.2 0.0 - 1.0 mg/dL   Nitrite NEGATIVE NEGATIVE   Leukocytes, UA NEGATIVE NEGATIVE  Urine microscopic-add on     Status: Abnormal   Collection Time: 06/07/15  7:10 AM  Result Value  Ref Range   Squamous Epithelial / LPF MANY (A) RARE   WBC, UA 3-6 <3 WBC/hpf   RBC / HPF 3-6 <3 RBC/hpf   Bacteria, UA RARE RARE   Urine-Other MUCOUS PRESENT   Pregnancy, urine POC     Status: Abnormal   Collection Time: 06/07/15  9:19 AM  Result Value Ref Range   Preg Test, Ur POSITIVE (A) NEGATIVE   US Ob Comp Less 14 Wks  06/07/2015   CLINICAL DATA:  Vaginal bleeding. Estimated gestational age [redacted] weeks 2 days per LMP.  EXAM: OBSTETRIC <14 WK [place] AND TRANSVAGINAL OB US  TECHNIQUE: Both transabdominal and transvaginal  ultrasound examinations were performed for complete evaluation of the gestation as well as the maternal uterus, adnexal regions, and pelvic cul-de-sac. Transvaginal technique was performed to assess early pregnancy.  COMPARISON:  None.  FINDINGS: Intrauterine gestational sac: Visualized/normal in shape.  Yolk sac:  Visualized.  Embryo:  Visualized.  Cardiac Activity: Not visualized.  Heart Rate: Not visualized.  CRL:  12.7  mm   7 w   4 d  No evidence of subchorionic hemorrhage.  Maternal uterus/adnexae: Ovaries are within normal. Suggestion of small corpus luteum over the left ovary. No free pelvic fluid.  IMPRESSION: IUP with crown-rump length of 12.7 mm and no cardiac activity/ heart rate detected as findings are definitive for failed pregnancy.   Electronically Signed   By: Elberta Fortis M.D.   On: 06/07/2015 11:21   US Ob Transvaginal  06/07/2015   CLINICAL DATA:  Vaginal bleeding. Estimated gestational age [redacted] weeks 2 days per LMP.  EXAM: OBSTETRIC <14 WK [place] AND TRANSVAGINAL OB US  TECHNIQUE: Both transabdominal and transvaginal ultrasound examinations were performed for complete evaluation of the gestation as well as the maternal uterus, adnexal regions, and pelvic cul-de-sac. Transvaginal technique was performed to assess early pregnancy.  COMPARISON:  None.  FINDINGS: Intrauterine gestational sac: Visualized/normal in shape.  Yolk sac:  Visualized.  Embryo:  Visualized.  Cardiac Activity: Not visualized.  Heart Rate: Not visualized.  CRL:  12.7  mm   7 w   4 d  No evidence of subchorionic hemorrhage.  Maternal uterus/adnexae: Ovaries are within normal. Suggestion of small corpus luteum over the left ovary. No free pelvic fluid.  IMPRESSION: IUP with crown-rump length of 12.7 mm and no cardiac activity/ heart rate detected as findings are definitive for failed pregnancy.   Electronically Signed   By: Elberta Fortis M.D.   On: 06/07/2015 11:21     Assessment and Plan   1. Incomplete spontaneous abortion    2. Vaginal bleeding in pregnancy, first trimester   Discussed expectant mgmt vs. Misoprostol vs. D&C, pt elects to wait for SAB to progress, advised to f/u in office this week, precautions rev'd    Medication List    STOP taking these medications        oxyCODONE-acetaminophen 5-325 MG per tablet  Commonly known as:  PERCOCET/ROXICET      TAKE these medications        ibuprofen 600 MG tablet  Commonly known as:  ADVIL,MOTRIN  Take 1 tablet (600 mg total) by mouth every 6 (six) hours as needed for pain.     prenatal multivitamin Tabs tablet  Take 1 tablet by mouth daily at 12 noon.            Follow-up Information    Follow up with [facility]. Call on 06/08/2015.   Why:  for appointment this  week   Contact information:   [address]  [address] [place] [address] [phone]         Luv Mish 06/07/2015, 12:16 PM

## 2015-06-07 NOTE — MAU Note (Signed)
Patient presents at [redacted] weeks gestation with c/o bleeding.

## 2015-11-03 LAB — OB RESULTS CONSOLE RPR: RPR: NONREACTIVE

## 2015-11-03 LAB — OB RESULTS CONSOLE RUBELLA ANTIBODY, IGM: Rubella: IMMUNE

## 2015-11-03 LAB — OB RESULTS CONSOLE ABO/RH: RH Type: POSITIVE

## 2015-11-03 LAB — OB RESULTS CONSOLE ANTIBODY SCREEN: Antibody Screen: NEGATIVE

## 2015-11-03 LAB — OB RESULTS CONSOLE GC/CHLAMYDIA
CHLAMYDIA, DNA PROBE: NEGATIVE
Gonorrhea: NEGATIVE

## 2015-11-03 LAB — OB RESULTS CONSOLE HIV ANTIBODY (ROUTINE TESTING): HIV: NONREACTIVE

## 2015-11-03 LAB — OB RESULTS CONSOLE HEPATITIS B SURFACE ANTIGEN: Hepatitis B Surface Ag: NEGATIVE

## 2016-04-14 LAB — OB RESULTS CONSOLE GBS: STREP GROUP B AG: NEGATIVE

## 2016-04-25 ENCOUNTER — Inpatient Hospital Stay (HOSPITAL_COMMUNITY)
Admission: AD | Admit: 2016-04-25 | Discharge: 2016-04-25 | Disposition: A | Discharge status: Home / Self Care | Payer: Commercial Managed Care - PPO | Source: Ambulatory Visit | Attending: Obstetrics and Gynecology | Admitting: Obstetrics and Gynecology

## 2016-04-25 ENCOUNTER — Encounter (HOSPITAL_COMMUNITY): Payer: Self-pay

## 2016-04-25 DIAGNOSIS — O26893 Other specified pregnancy related conditions, third trimester: Secondary | ICD-10-CM | POA: Insufficient documentation

## 2016-04-25 DIAGNOSIS — Z3A36 36 weeks gestation of pregnancy: Secondary | ICD-10-CM | POA: Insufficient documentation

## 2016-04-25 DIAGNOSIS — N898 Other specified noninflammatory disorders of vagina: Secondary | ICD-10-CM | POA: Insufficient documentation

## 2016-04-25 LAB — POCT FERN TEST: POCT FERN TEST: NEGATIVE

## 2016-04-25 NOTE — MAU Note (Signed)
Pt c/o clear, watery discharge that started last night but has continued to leak throughout that day. Denies vaginal bleeding. Having some irregular contractions-rates 2/10. +FM.

## 2016-04-25 NOTE — MAU Provider Note (Signed)
Ms. Julia Bell is a 30 y.o. G3P1001 at 7040w5d  who presents to MAU today complaining of possible LOF off and on since last night. She states that she has noted "extra" discharge after using the restroom. She denies vaginal bleeding or complications with the pregnancy. She reports good fetal movement and denies regular contractions.    BP 116/80 mmHg  Pulse 86  Temp(Src) 98.4 F (36.9 C) (Oral)  Resp 18  Ht 5\' 3"  (1.6 m)  Wt 155 lb (70.308 kg)  BMI 27.46 kg/m2  SpO2 99%  LMP 04/03/2015 (Exact Date) GENERAL: Well-developed, well-nourished female in no acute distress.  HEAD: Normocephalic, atraumatic.  CHEST: Normal effort of breathing, regular heart rate ABDOMEN: Soft, nontender, nondistended.  PELVIC: Normal external female genitalia. Vagina is pink and rugated.  Normal discharge.  Negative pooling. Gravid uterus.   EXTREMITIES: No cyanosis, clubbing, or edema  Fern - negative  Fetal Monitoring:  Baseline: 130 bpm, moderate variability, + accelerations, no decelerations Contractions: moderate UI with occasional contractions  A: SIUP at 1040w5d Membranes intact  P: Report given to RN to contact MD on call for further instructions  Marny LowensteinJulie N Donnivan Villena, PA-C 04/25/2016 10:37 PM

## 2016-05-05 ENCOUNTER — Telehealth (HOSPITAL_COMMUNITY): Payer: Self-pay | Admitting: *Deleted

## 2016-05-05 NOTE — Telephone Encounter (Signed)
Preadmission screen  

## 2016-05-06 ENCOUNTER — Encounter (HOSPITAL_COMMUNITY): Payer: Self-pay

## 2016-05-17 ENCOUNTER — Encounter (HOSPITAL_COMMUNITY): Payer: Self-pay

## 2016-05-17 ENCOUNTER — Encounter (HOSPITAL_COMMUNITY)
Admission: RE | Admit: 2016-05-17 | Discharge: 2016-05-17 | Disposition: A | Discharge status: Home / Self Care | Payer: Commercial Managed Care - PPO | Source: Ambulatory Visit | Attending: Obstetrics and Gynecology | Admitting: Obstetrics and Gynecology

## 2016-05-17 DIAGNOSIS — Z3493 Encounter for supervision of normal pregnancy, unspecified, third trimester: Secondary | ICD-10-CM | POA: Insufficient documentation

## 2016-05-17 DIAGNOSIS — Z3A40 40 weeks gestation of pregnancy: Secondary | ICD-10-CM | POA: Insufficient documentation

## 2016-05-17 DIAGNOSIS — Z01812 Encounter for preprocedural laboratory examination: Secondary | ICD-10-CM | POA: Insufficient documentation

## 2016-05-17 LAB — CBC
HCT: 34.3 % — ABNORMAL LOW (ref 36.0–46.0)
Hemoglobin: 11.6 g/dL — ABNORMAL LOW (ref 12.0–15.0)
MCH: 28.2 pg (ref 26.0–34.0)
MCHC: 33.8 g/dL (ref 30.0–36.0)
MCV: 83.5 fL (ref 78.0–100.0)
Platelets: 184 10*3/uL (ref 150–400)
RBC: 4.11 MIL/uL (ref 3.87–5.11)
RDW: 13.6 % (ref 11.5–15.5)
WBC: 7.7 10*3/uL (ref 4.0–10.5)

## 2016-05-17 LAB — TYPE AND SCREEN
ABO/RH(D): O POS
ANTIBODY SCREEN: NEGATIVE

## 2016-05-17 NOTE — Patient Instructions (Signed)
20 Julia Bell  05/17/2016   Your procedure is scheduled on:  05/18/2016  [facility].  Pick up the phone at the desk and dial [phone].   Call this number if you have problems the morning of surgery: [phone]   Remember:   Do not eat food:After Midnight.  Do not drink clear liquids: After Midnight.  Take these medicines the morning of surgery with A SIP OF WATER: none   Do not wear jewelry, make-up or nail polish.  Do not wear lotions, powders, or perfumes. You may wear deodorant.  Do not shave 48 hours prior to surgery.  Do not bring valuables to the hospital.  [facility] is not   responsible for any belongings or valuables brought to the hospital.  Contacts, dentures or bridgework may not be worn into surgery.  Leave suitcase in the car. After surgery it may be brought to your room.  For patients admitted to the hospital, checkout time is 11:00 AM the day of              discharge.   Patients discharged the day of surgery will not be allowed to drive             home.  Name and phone number of your driver: na  Special Instructions:   Shower using CHG 2 nights before surgery and the night before surgery.  If you shower the day of surgery use CHG.  Use special wash - you have one bottle of CHG for all showers.  You should use approximately 1/3 of the bottle for each shower.   Please read over the following fact sheets that you were given:   Surgical Site Infection Prevention

## 2016-05-17 NOTE — H&P (Signed)
Julia Bell is a 30 y.o. female G2P1001 at 40 weeks (EDD 05/18/16 by LMP c/w 6 week US) presenting for repeat c-section. Prenatal care uneventful.    Maternal Medical History:  Contractions: Frequency: rare.   Perceived severity is mild.    Fetal activity: Perceived fetal activity is normal.    Prenatal Complications - Diabetes: none.    OB History    Gravida Para Term Preterm AB TAB SAB Ectopic Multiple Living   3 1 1       1     2014 8#11oz C-section arrest of descent   Past Medical History  Diagnosis Date  . Medical history non-contributory    Past Surgical History  Procedure Laterality Date  . No past surgeries    . Cesarean section N/A 02/27/2013    Procedure: CESAREAN SECTION;  Surgeon: Bing Plumehomas F Henley, MD;  Location: [facility];  Service: Obstetrics;  Laterality: N/A;  Primary Cesarean Section Delivery Baby Girl @ 0101   Apgars 7/9   Family History: family history includes Diabetes in her maternal grandfather; Heart attack in her maternal grandfather. Social History:  reports that she has never smoked. She has never used smokeless tobacco. She reports that she does not drink alcohol or use illicit drugs.   Prenatal Transfer Tool  Maternal Diabetes: No Genetic Screening: Declined Maternal Ultrasounds/Referrals: Normal Fetal Ultrasounds or other Referrals:  None Maternal Substance Abuse:  No Significant Maternal Medications:  None Significant Maternal Lab Results:  None Other Comments:  None  Review of Systems  Gastrointestinal: Negative for abdominal pain.      Last menstrual period 04/03/2015, unknown if currently breastfeeding. Maternal Exam:  Uterine Assessment: Contraction strength is mild.  Contraction frequency is rare.   Abdomen: Patient reports no abdominal tenderness. Introitus: Normal vulva. Normal vagina.    Physical Exam  Constitutional: She is oriented to person, place, and time. She appears well-developed and well-nourished.  Cardiovascular:  Normal rate and regular rhythm.   Respiratory: Effort normal and breath sounds normal.  GI: Soft.  Genitourinary: Vagina normal.  Neurological: She is alert and oriented to person, place, and time.  Psychiatric: She has a normal mood and affect.    Prenatal labs: ABO, Rh: --/--/O POS (06/06 1410) Antibody: NEG (06/06 1410) Rubella: Immune (11/22 0000) RPR: Nonreactive (11/22 0000)  HBsAg: Negative (11/22 0000)  HIV: Non-reactive (11/22 0000)  GBS: Negative (05/04 0000)  One hour GTT 84 Declined genetics   Assessment/Plan: D/w pt risks and benefits of repeat c-section including bleeding,infection and possible damage to bowel and bladder.  Pt desires to proceed.  Oliver PilaICHARDSON,Bentlee Drier W 05/17/2016, 7:08 PM

## 2016-05-18 ENCOUNTER — Inpatient Hospital Stay (HOSPITAL_COMMUNITY): Payer: Self-pay | Admitting: Anesthesiology

## 2016-05-18 ENCOUNTER — Inpatient Hospital Stay (HOSPITAL_COMMUNITY)
Admission: RE | Admit: 2016-05-18 | Discharge: 2016-05-21 | DRG: 766 | Disposition: A | Discharge status: Home / Self Care | Payer: Self-pay | Source: Ambulatory Visit | Attending: Obstetrics and Gynecology | Admitting: Obstetrics and Gynecology

## 2016-05-18 ENCOUNTER — Encounter (HOSPITAL_COMMUNITY)
Admission: RE | Disposition: A | Discharge status: Home / Self Care | Payer: Self-pay | Source: Ambulatory Visit | Attending: Obstetrics and Gynecology

## 2016-05-18 ENCOUNTER — Encounter (HOSPITAL_COMMUNITY): Payer: Self-pay

## 2016-05-18 DIAGNOSIS — Z3A40 40 weeks gestation of pregnancy: Secondary | ICD-10-CM

## 2016-05-18 DIAGNOSIS — Z833 Family history of diabetes mellitus: Secondary | ICD-10-CM

## 2016-05-18 DIAGNOSIS — Z98891 History of uterine scar from previous surgery: Secondary | ICD-10-CM

## 2016-05-18 DIAGNOSIS — O34211 Maternal care for low transverse scar from previous cesarean delivery: Principal | ICD-10-CM | POA: Diagnosis present

## 2016-05-18 DIAGNOSIS — Z8249 Family history of ischemic heart disease and other diseases of the circulatory system: Secondary | ICD-10-CM

## 2016-05-18 LAB — SYPHILIS: RPR W/REFLEX TO RPR TITER AND TREPONEMAL ANTIBODIES, TRADITIONAL SCREENING AND DIAGNOSIS ALGORITHM: RPR Ser Ql: NONREACTIVE

## 2016-05-18 SURGERY — Surgical Case
Anesthesia: Spinal

## 2016-05-18 MED ORDER — ERYTHROMYCIN 5 MG/GM OP OINT
TOPICAL_OINTMENT | OPHTHALMIC | Status: AC
  Filled 2016-05-18: qty 1

## 2016-05-18 MED ORDER — SODIUM CHLORIDE 0.9% FLUSH: 3.0000 mL | INTRAVENOUS | Status: DC | PRN

## 2016-05-18 MED ORDER — LACTATED RINGERS IV SOLN
INTRAVENOUS | Status: DC
  Administered 2016-05-18 (×2): via INTRAVENOUS

## 2016-05-18 MED ORDER — ONDANSETRON HCL 4 MG/2ML IJ SOLN: 4.0000 mg | Freq: Once | INTRAMUSCULAR | Status: DC | PRN

## 2016-05-18 MED ORDER — KETOROLAC TROMETHAMINE 30 MG/ML IJ SOLN: 30.0000 mg | Freq: Four times a day (QID) | INTRAMUSCULAR | Status: DC | PRN

## 2016-05-18 MED ORDER — NALBUPHINE HCL 10 MG/ML IJ SOLN: 5.0000 mg | Freq: Once | INTRAMUSCULAR | Status: DC | PRN

## 2016-05-18 MED ORDER — KETOROLAC TROMETHAMINE 30 MG/ML IJ SOLN
30.0000 mg | Freq: Four times a day (QID) | INTRAMUSCULAR | Status: AC | PRN
  Filled 2016-05-18: qty 1

## 2016-05-18 MED ORDER — SIMETHICONE 80 MG PO CHEW: 80.0000 mg | CHEWABLE_TABLET | ORAL | Status: DC | PRN

## 2016-05-18 MED ORDER — OXYTOCIN 10 UNIT/ML IJ SOLN
40.0000 [IU] | INTRAMUSCULAR | Status: DC | PRN
  Administered 2016-05-18: 40 [IU] via INTRAVENOUS

## 2016-05-18 MED ORDER — NALOXONE HCL 2 MG/2ML IJ SOSY
1.0000 ug/kg/h | PREFILLED_SYRINGE | INTRAVENOUS | Status: DC | PRN
  Filled 2016-05-18: qty 2

## 2016-05-18 MED ORDER — DIPHENHYDRAMINE HCL 25 MG PO CAPS
25.0000 mg | ORAL_CAPSULE | ORAL | Status: DC | PRN
  Filled 2016-05-18: qty 1

## 2016-05-18 MED ORDER — NALBUPHINE HCL 10 MG/ML IJ SOLN: 5.0000 mg | INTRAMUSCULAR | Status: DC | PRN

## 2016-05-18 MED ORDER — COCONUT OIL OIL
1.0000 | TOPICAL_OIL | Status: DC | PRN
Start: 2016-05-18 — End: 2016-05-21

## 2016-05-18 MED ORDER — BUPIVACAINE IN DEXTROSE 0.75-8.25 % IT SOLN
INTRATHECAL | Status: DC | PRN
  Administered 2016-05-18: 9 mg via INTRATHECAL

## 2016-05-18 MED ORDER — SIMETHICONE 80 MG PO CHEW
80.0000 mg | CHEWABLE_TABLET | ORAL | Status: DC
  Administered 2016-05-19 (×2): 80 mg via ORAL
  Filled 2016-05-18 (×3): qty 1

## 2016-05-18 MED ORDER — SCOPOLAMINE 1 MG/3DAYS TD PT72: 1.0000 | MEDICATED_PATCH | Freq: Once | TRANSDERMAL | Status: DC

## 2016-05-18 MED ORDER — KETOROLAC TROMETHAMINE 30 MG/ML IJ SOLN
30.0000 mg | Freq: Four times a day (QID) | INTRAMUSCULAR | Status: AC
  Administered 2016-05-18: 30 mg via INTRAMUSCULAR

## 2016-05-18 MED ORDER — MENTHOL 3 MG MT LOZG: 1.0000 | LOZENGE | OROMUCOSAL | Status: DC | PRN

## 2016-05-18 MED ORDER — LACTATED RINGERS IV SOLN
INTRAVENOUS | Status: DC | PRN
  Administered 2016-05-18: 10:00:00 via INTRAVENOUS

## 2016-05-18 MED ORDER — SENNOSIDES-DOCUSATE SODIUM 8.6-50 MG PO TABS
2.0000 | ORAL_TABLET | ORAL | Status: DC
  Administered 2016-05-19 – 2016-05-21 (×3): 2 via ORAL
  Filled 2016-05-18 (×3): qty 2

## 2016-05-18 MED ORDER — ZOLPIDEM TARTRATE 5 MG PO TABS
5.0000 mg | ORAL_TABLET | Freq: Every evening | ORAL | Status: DC | PRN
  Filled 2016-05-18: qty 1

## 2016-05-18 MED ORDER — ACETAMINOPHEN 500 MG PO TABS: 1000.0000 mg | ORAL_TABLET | Freq: Four times a day (QID) | ORAL | Status: DC

## 2016-05-18 MED ORDER — MORPHINE SULFATE (PF) 0.5 MG/ML IJ SOLN
INTRAMUSCULAR | Status: AC
  Filled 2016-05-18: qty 10

## 2016-05-18 MED ORDER — MEPERIDINE HCL 25 MG/ML IJ SOLN: 6.2500 mg | INTRAMUSCULAR | Status: DC | PRN

## 2016-05-18 MED ORDER — LACTATED RINGERS IV SOLN: INTRAVENOUS | Status: DC

## 2016-05-18 MED ORDER — LACTATED RINGERS IV SOLN
INTRAVENOUS | Status: DC
  Administered 2016-05-18: 13:00:00 via INTRAVENOUS

## 2016-05-18 MED ORDER — SIMETHICONE 80 MG PO CHEW
80.0000 mg | CHEWABLE_TABLET | Freq: Three times a day (TID) | ORAL | Status: DC
  Administered 2016-05-19 – 2016-05-21 (×8): 80 mg via ORAL
  Filled 2016-05-18 (×8): qty 1

## 2016-05-18 MED ORDER — ACETAMINOPHEN 500 MG PO TABS
1000.0000 mg | ORAL_TABLET | Freq: Four times a day (QID) | ORAL | Status: AC
  Administered 2016-05-18: 1000 mg via ORAL
  Filled 2016-05-18: qty 2

## 2016-05-18 MED ORDER — OXYTOCIN 10 UNIT/ML IJ SOLN
INTRAMUSCULAR | Status: AC
  Filled 2016-05-18: qty 4

## 2016-05-18 MED ORDER — FENTANYL CITRATE (PF) 100 MCG/2ML IJ SOLN
INTRAMUSCULAR | Status: DC | PRN
  Administered 2016-05-18: 10 ug via INTRATHECAL

## 2016-05-18 MED ORDER — IBUPROFEN 600 MG PO TABS: 600.0000 mg | ORAL_TABLET | Freq: Four times a day (QID) | ORAL | Status: DC | PRN

## 2016-05-18 MED ORDER — WITCH HAZEL-GLYCERIN EX PADS: 1.0000 "application " | MEDICATED_PAD | CUTANEOUS | Status: DC | PRN

## 2016-05-18 MED ORDER — MORPHINE SULFATE (PF) 0.5 MG/ML IJ SOLN
INTRAMUSCULAR | Status: DC | PRN
  Administered 2016-05-18: .2 mg via INTRATHECAL

## 2016-05-18 MED ORDER — SCOPOLAMINE 1 MG/3DAYS TD PT72
1.0000 | MEDICATED_PATCH | Freq: Once | TRANSDERMAL | Status: DC
  Administered 2016-05-18: 1.5 mg via TRANSDERMAL

## 2016-05-18 MED ORDER — PRENATAL MULTIVITAMIN CH
1.0000 | ORAL_TABLET | Freq: Every day | ORAL | Status: DC
  Administered 2016-05-19 – 2016-05-21 (×3): 1 via ORAL
  Filled 2016-05-18 (×3): qty 1

## 2016-05-18 MED ORDER — ONDANSETRON HCL 4 MG/2ML IJ SOLN
INTRAMUSCULAR | Status: AC
  Filled 2016-05-18: qty 2

## 2016-05-18 MED ORDER — ONDANSETRON HCL 4 MG/2ML IJ SOLN: 4.0000 mg | Freq: Three times a day (TID) | INTRAMUSCULAR | Status: DC | PRN

## 2016-05-18 MED ORDER — TETANUS-DIPHTH-ACELL PERTUSSIS 5-2.5-18.5 LF-MCG/0.5 IM SUSP: 0.5000 mL | Freq: Once | INTRAMUSCULAR | Status: DC

## 2016-05-18 MED ORDER — NALOXONE HCL 0.4 MG/ML IJ SOLN
0.4000 mg | INTRAMUSCULAR | Status: DC | PRN
Start: 2016-05-18 — End: 2016-05-21

## 2016-05-18 MED ORDER — PHENYLEPHRINE 8 MG IN D5W 100 ML (0.08MG/ML) PREMIX OPTIME
INJECTION | INTRAVENOUS | Status: DC | PRN
  Administered 2016-05-18: 60 ug/min via INTRAVENOUS

## 2016-05-18 MED ORDER — KETOROLAC TROMETHAMINE 30 MG/ML IJ SOLN: 30.0000 mg | Freq: Four times a day (QID) | INTRAMUSCULAR | Status: AC | PRN

## 2016-05-18 MED ORDER — SODIUM CHLORIDE 0.9 % IR SOLN
Status: DC | PRN
  Administered 2016-05-18: 1000 mL

## 2016-05-18 MED ORDER — IBUPROFEN 600 MG PO TABS
600.0000 mg | ORAL_TABLET | Freq: Four times a day (QID) | ORAL | Status: DC
  Administered 2016-05-19 – 2016-05-21 (×11): 600 mg via ORAL
  Filled 2016-05-18 (×11): qty 1

## 2016-05-18 MED ORDER — DIBUCAINE 1 % RE OINT: 1.0000 "application " | TOPICAL_OINTMENT | RECTAL | Status: DC | PRN

## 2016-05-18 MED ORDER — HYDROMORPHONE HCL 1 MG/ML IJ SOLN: 0.2500 mg | INTRAMUSCULAR | Status: DC | PRN

## 2016-05-18 MED ORDER — DIPHENHYDRAMINE HCL 50 MG/ML IJ SOLN: 12.5000 mg | INTRAMUSCULAR | Status: DC | PRN

## 2016-05-18 MED ORDER — SCOPOLAMINE 1 MG/3DAYS TD PT72
MEDICATED_PATCH | TRANSDERMAL | Status: AC
  Administered 2016-05-18: 1.5 mg via TRANSDERMAL
  Filled 2016-05-18: qty 1

## 2016-05-18 MED ORDER — OXYTOCIN 40 UNITS IN LACTATED RINGERS INFUSION - SIMPLE MED: 2.5000 [IU]/h | INTRAVENOUS | Status: DC

## 2016-05-18 MED ORDER — VITAMIN K1 1 MG/0.5ML IJ SOLN
INTRAMUSCULAR | Status: DC
Start: 2016-05-18 — End: 2016-05-18
  Filled 2016-05-18: qty 0.5

## 2016-05-18 MED ORDER — KETOROLAC TROMETHAMINE 30 MG/ML IJ SOLN
INTRAMUSCULAR | Status: AC
  Administered 2016-05-18: 30 mg via INTRAMUSCULAR
  Filled 2016-05-18: qty 1

## 2016-05-18 MED ORDER — OXYCODONE HCL 5 MG PO TABS: 10.0000 mg | ORAL_TABLET | ORAL | Status: DC | PRN

## 2016-05-18 MED ORDER — DIPHENHYDRAMINE HCL 25 MG PO CAPS: 25.0000 mg | ORAL_CAPSULE | Freq: Four times a day (QID) | ORAL | Status: DC | PRN

## 2016-05-18 MED ORDER — ACETAMINOPHEN 325 MG PO TABS
650.0000 mg | ORAL_TABLET | ORAL | Status: DC | PRN
  Administered 2016-05-19 – 2016-05-21 (×6): 650 mg via ORAL
  Filled 2016-05-18 (×6): qty 2

## 2016-05-18 MED ORDER — PHENYLEPHRINE 8 MG IN D5W 100 ML (0.08MG/ML) PREMIX OPTIME
INJECTION | INTRAVENOUS | Status: AC
  Filled 2016-05-18: qty 100

## 2016-05-18 MED ORDER — ONDANSETRON HCL 4 MG/2ML IJ SOLN
INTRAMUSCULAR | Status: DC | PRN
  Administered 2016-05-18: 4 mg via INTRAVENOUS

## 2016-05-18 MED ORDER — OXYCODONE HCL 5 MG PO TABS: 5.0000 mg | ORAL_TABLET | ORAL | Status: DC | PRN

## 2016-05-18 MED ORDER — NALOXONE HCL 0.4 MG/ML IJ SOLN: 0.4000 mg | INTRAMUSCULAR | Status: DC | PRN

## 2016-05-18 MED ORDER — CEFAZOLIN SODIUM-DEXTROSE 2-4 GM/100ML-% IV SOLN
2.0000 g | INTRAVENOUS | Status: AC
  Administered 2016-05-18: 2 g via INTRAVENOUS

## 2016-05-18 MED ORDER — FENTANYL CITRATE (PF) 100 MCG/2ML IJ SOLN
INTRAMUSCULAR | Status: AC
  Filled 2016-05-18: qty 2

## 2016-05-18 MED ORDER — DIPHENHYDRAMINE HCL 25 MG PO CAPS: 25.0000 mg | ORAL_CAPSULE | ORAL | Status: DC | PRN

## 2016-05-18 SURGICAL SUPPLY — 36 items
BENZOIN TINCTURE PRP APPL 2/3 (GAUZE/BANDAGES/DRESSINGS) ×3 IMPLANT
CLAMP CORD UMBIL (MISCELLANEOUS) IMPLANT
CLOSURE WOUND 1/2 X4 (GAUZE/BANDAGES/DRESSINGS) ×1
CLOTH BEACON ORANGE TIMEOUT ST (SAFETY) ×3 IMPLANT
DRSG OPSITE POSTOP 4X10 (GAUZE/BANDAGES/DRESSINGS) ×3 IMPLANT
DURAPREP 26ML APPLICATOR (WOUND CARE) ×3 IMPLANT
ELECT REM PT RETURN 9FT ADLT (ELECTROSURGICAL) ×3
ELECTRODE REM PT RTRN 9FT ADLT (ELECTROSURGICAL) ×1 IMPLANT
EXTRACTOR VACUUM KIWI (MISCELLANEOUS) IMPLANT
GLOVE BIO SURGEON STRL SZ 6.5 (GLOVE) ×2 IMPLANT
GLOVE BIO SURGEONS STRL SZ 6.5 (GLOVE) ×1
GLOVE BIOGEL PI IND STRL 7.0 (GLOVE) ×1 IMPLANT
GLOVE BIOGEL PI INDICATOR 7.0 (GLOVE) ×2
GOWN STRL REUS W/TWL LRG LVL3 (GOWN DISPOSABLE) ×6 IMPLANT
KIT ABG SYR 3ML LUER SLIP (SYRINGE) IMPLANT
NEEDLE HYPO 25X5/8 SAFETYGLIDE (NEEDLE) IMPLANT
NS IRRIG 1000ML POUR BTL (IV SOLUTION) ×3 IMPLANT
PACK C SECTION WH (CUSTOM PROCEDURE TRAY) ×3 IMPLANT
PAD OB MATERNITY 4.3X12.25 (PERSONAL CARE ITEMS) ×3 IMPLANT
PENCIL SMOKE EVAC W/HOLSTER (ELECTROSURGICAL) ×3 IMPLANT
RTRCTR C-SECT PINK 25CM LRG (MISCELLANEOUS) ×3 IMPLANT
STRIP CLOSURE SKIN 1/2X4 (GAUZE/BANDAGES/DRESSINGS) ×2 IMPLANT
SUT CHROMIC 1 CTX 36 (SUTURE) ×6 IMPLANT
SUT PLAIN 0 NONE (SUTURE) IMPLANT
SUT PLAIN 2 0 XLH (SUTURE) ×3 IMPLANT
SUT VIC AB 0 CT1 27 (SUTURE) ×4
SUT VIC AB 0 CT1 27XBRD ANBCTR (SUTURE) ×2 IMPLANT
SUT VIC AB 2-0 CT1 27 (SUTURE) ×2
SUT VIC AB 2-0 CT1 TAPERPNT 27 (SUTURE) ×1 IMPLANT
SUT VIC AB 3-0 CT1 27 (SUTURE)
SUT VIC AB 3-0 CT1 TAPERPNT 27 (SUTURE) IMPLANT
SUT VIC AB 3-0 SH 27 (SUTURE) ×2
SUT VIC AB 3-0 SH 27X BRD (SUTURE) ×1 IMPLANT
SUT VIC AB 4-0 KS 27 (SUTURE) ×3 IMPLANT
TOWEL OR 17X24 6PK STRL BLUE (TOWEL DISPOSABLE) ×3 IMPLANT
TRAY FOLEY CATH SILVER 14FR (SET/KITS/TRAYS/PACK) ×3 IMPLANT

## 2016-05-18 NOTE — Op Note (Signed)
Operative Note    Preoperative Diagnosis Term Pregnancy at 40 weeks Prior c-section, declines TOL  Postoperative Diagnosis same  Procedure Repeat Low Transverse C-section with two layer closure of uterus  Surgeon Huel CoteKathy Alichia Alridge, MD  Anesthesia Spinal  Fluids: EBL 800cc UOP 200cc IVF 2200cc LR  Findings Viable female infant in the vertex presentation Apgars 8,9 Weight pending Normal uterus tubes and ovaries  Specimen Placenta to L&D    Procedure Note Patient was taken to the operating room where spinal anesthesia was obtained and found to be adequate by Allis clamp test. She was prepped and draped in the normal sterile fashion in the dorsal supine position with a leftward tilt. An appropriate time out was performed. A Pfannenstiel skin incision was then made through a pre-existing scar with the scalpel and carried through to the underlying layer of fascia by sharp dissection and Bovie cautery. The fascia was nicked in the midline and the incision was extended laterally with Mayo scissors. The inferior aspect of the incision was grasped Coker clamps and dissected off the underlying rectus muscles. In a similar fashion the superior aspect was dissected off the rectus muscles. Rectus muscles were separated in the midline and the peritoneal cavity entered bluntly. The peritoneal incision was then extended both superiorly and inferiorly with careful attention to avoid both bowel and bladder. The Alexis self-retaining wound retractor was then placed within the incision and the lower uterine segment exposed. The bladder flap was developed with Metzenbaum scissors and pushed away from the lower uterine segment. The lower uterine segment was then incised in a transverse fashion and the cavity itself entered bluntly. The incision was extended bluntly. The infant's head was then lifted but delivery could not quite be effected so a vacuum was placed and the head gently guided up through the  incision.  No pop-offs.   The remainder of the infant delivered and the nose and mouth bulb suctioned with the cord clamped and cut as well. The infant was handed off to the waiting pediatricians. The placenta was then spontaneously expressed from the uterus and the uterus cleared of all clots and debris with moist lap sponge. The uterine incision was then repaired in 2 layers the first layer was a running locked layer of 1-0 chromic and the second an imbricating layer of the same suture. The tubes and ovaries were inspected and the gutters cleared of all clots and debris. The uterine incision was inspected and found to be hemostatic. All instruments and sponges as well as the Alexis retractor were then removed from the abdomen. The rectus muscles and peritoneum were then reapproximated with a running suture of 2-0 Vicryl. The fascia was then closed with 0 Vicryl in a running fashion. Subcutaneous tissue was reapproximated with 3-0 plain in a running fashion. The skin was closed with a subcuticular stitch of 4-0 Vicryl on a Keith needle and then reinforced with benzoin and Steri-Strips. At the conclusion of the procedure all instruments and sponge counts were correct. Patient was taken to the recovery room in good condition with her baby accompanying her skin to skin.

## 2016-05-18 NOTE — Addendum Note (Signed)
Addendum  created 05/18/16 1428 by Leilani AbleFranklin Johnesha Acheampong, MD   Modules edited: Orders, PRL Based Order Sets

## 2016-05-18 NOTE — Progress Notes (Signed)
Patient ID: Julia Bell, female   DOB: [birth year], 30 y.o.   MRN: [id number] Per pt no changes in dictated H&P.   Brief exam WNL. Ready to proceed.

## 2016-05-18 NOTE — Anesthesia Procedure Notes (Signed)
Spinal Patient location during procedure: OR Start time: 05/18/2016 9:12 AM End time: 05/18/2016 9:15 AM Staffing Anesthesiologist: Leilani AbleHATCHETT, Juliyah Mergen Performed by: anesthesiologist  Preanesthetic Checklist Completed: patient identified, surgical consent, pre-op evaluation, timeout performed, IV checked, risks and benefits discussed and monitors and equipment checked Spinal Block Patient position: sitting Prep: site prepped and draped and DuraPrep Patient monitoring: heart rate, cardiac monitor, continuous pulse ox and blood pressure Approach: midline Location: L3-4 Injection technique: single-shot Needle Needle type: Sprotte  Needle gauge: 24 G Needle length: 9 cm Needle insertion depth: 4 cm Assessment Sensory level: T4

## 2016-05-18 NOTE — Transfer of Care (Signed)
Immediate Anesthesia Transfer of Care Note  Patient: Julia Bell  Procedure(s) Performed: Procedure(s) with comments: CESAREAN SECTION (N/A) - RNFA request Kennith Centerracey or Heather  Patient Location: PACU  Anesthesia Type:Spinal  Level of Consciousness: awake, alert  and oriented  Airway & Oxygen Therapy: Patient Spontanous Breathing  Post-op Assessment: Report given to RN and Post -op Vital signs reviewed and stable  Post vital signs: Reviewed and stable  Last Vitals:  Filed Vitals:   05/18/16 0820  BP: 113/71  Pulse: 56  Temp: 36.6 C  Resp: 16    Last Pain:  Filed Vitals:   05/18/16 0950  PainSc: 1       Patients Stated Pain Goal: 3 (05/18/16 0820)  Complications: No apparent anesthesia complications

## 2016-05-18 NOTE — Anesthesia Preprocedure Evaluation (Signed)
Anesthesia Evaluation  Patient identified by MRN, date of birth, ID band Patient awake    Reviewed: Allergy & Precautions, H&P , NPO status , Patient's Chart, lab work & pertinent test results  Airway Mallampati: I  TM Distance: >3 FB Neck ROM: full    Dental no notable dental hx.    Pulmonary neg pulmonary ROS,    Pulmonary exam normal        Cardiovascular negative cardio ROS Normal cardiovascular exam     Neuro/Psych negative neurological ROS  negative psych ROS   GI/Hepatic negative GI ROS, Neg liver ROS,   Endo/Other  negative endocrine ROS  Renal/GU negative Renal ROS     Musculoskeletal   Abdominal Normal abdominal exam  (+)   Peds  Hematology negative hematology ROS (+)   Anesthesia Other Findings   Reproductive/Obstetrics (+) Pregnancy                             Anesthesia Physical Anesthesia Plan  ASA: II  Anesthesia Plan: Spinal   Post-op Pain Management:    Induction:   Airway Management Planned:   Additional Equipment:   Intra-op Plan:   Post-operative Plan:   Informed Consent: I have reviewed the patients History and Physical, chart, labs and discussed the procedure including the risks, benefits and alternatives for the proposed anesthesia with the patient or authorized representative who has indicated his/her understanding and acceptance.     Plan Discussed with: CRNA and Surgeon  Anesthesia Plan Comments:         Anesthesia Quick Evaluation  

## 2016-05-18 NOTE — Anesthesia Postprocedure Evaluation (Signed)
Anesthesia Post Note  Patient: Julia Bell  Procedure(s) Performed: Procedure(s) (LRB): CESAREAN SECTION (N/A)  Patient location during evaluation: PACU Anesthesia Type: Spinal Level of consciousness: awake Pain management: pain level controlled Vital Signs Assessment: post-procedure vital signs reviewed and stable Respiratory status: spontaneous breathing Cardiovascular status: stable Postop Assessment: no headache, no backache, spinal receding, patient able to bend at knees and no signs of nausea or vomiting Anesthetic complications: no     Last Vitals:  Filed Vitals:   05/18/16 1115 05/18/16 1130  BP: 99/49 97/56  Pulse: 55 55  Temp:    Resp: 16 25    Last Pain:  Filed Vitals:   05/18/16 1147  PainSc: 0-No pain   Pain Goal: Patients Stated Pain Goal: 3 (05/18/16 0820)               Sorren Vallier JR,JOHN Susann GivensFRANKLIN

## 2016-05-19 LAB — CBC
HCT: 27.1 % — ABNORMAL LOW (ref 36.0–46.0)
Hemoglobin: 9.2 g/dL — ABNORMAL LOW (ref 12.0–15.0)
MCH: 28.4 pg (ref 26.0–34.0)
MCHC: 33.9 g/dL (ref 30.0–36.0)
MCV: 83.6 fL (ref 78.0–100.0)
PLATELETS: 131 10*3/uL — AB (ref 150–400)
RBC: 3.24 MIL/uL — ABNORMAL LOW (ref 3.87–5.11)
RDW: 13.5 % (ref 11.5–15.5)
WBC: 8.6 10*3/uL (ref 4.0–10.5)

## 2016-05-19 NOTE — Progress Notes (Addendum)
Subjective: Postpartum Day 1: Cesarean Delivery Patient reports incisional pain and tolerating PO.  Nl lochia, pain controlled.  Pt in NICU - talked to her, will check on her later.  Baby on abx, and tachypneic.    Objective: Vital signs in last 24 hours: Temp:  [97.3 F (36.3 C)-98.5 F (36.9 C)] 98.4 F (36.9 C) (06/08 0030) Pulse Rate:  [46-69] 61 (06/08 0030) Resp:  [11-25] 16 (06/08 0030) BP: (90-116)/(43-69) 103/61 mmHg (06/08 0030) SpO2:  [95 %-100 %] 96 % (06/08 0030)  Physical Exam:  General: alert and no distress Lochia: appropriate Uterine Fundus: firm Incision: healing well DVT Evaluation: No evidence of DVT seen on physical exam.   Recent Labs  05/17/16 1410 05/19/16 0543  HGB 11.6* 9.2*  HCT 34.3* 27.1*    Assessment/Plan: Status post Cesarean section. Doing well postoperatively.  Continue current care.  Bovard-Stuckert, Aldrich Lloyd 05/19/2016, 8:20 AM

## 2016-05-19 NOTE — Anesthesia Postprocedure Evaluation (Signed)
Anesthesia Post Note  Patient: Julia Bell  Procedure(s) Performed: Procedure(s) (LRB): CESAREAN SECTION (N/A)  Patient location during evaluation: Mother Baby Anesthesia Type: Spinal Level of consciousness: awake Pain management: satisfactory to patient Vital Signs Assessment: post-procedure vital signs reviewed and stable Respiratory status: spontaneous breathing Cardiovascular status: stable Anesthetic complications: no     Last Vitals:  Filed Vitals:   05/18/16 2127 05/19/16 0030  BP: 97/63 103/61  Pulse: 58 61  Temp: 36.9 C 36.9 C  Resp: 16 16    Last Pain:  Filed Vitals:   05/19/16 0642  PainSc: 3    Pain Goal: Patients Stated Pain Goal: 3 (05/18/16 0820)               Cephus ShellingBURGER,Daelin Haste

## 2016-05-19 NOTE — Progress Notes (Signed)
UR chart review completed.  

## 2016-05-19 NOTE — Addendum Note (Signed)
Addendum  created 05/19/16 [id number] by Algis GreenhouseLinda A Jaidan Prevette, CRNA   Modules edited: Clinical Notes   Clinical Notes:  File: [id number]

## 2016-05-19 NOTE — Lactation Note (Signed)
This note was copied from a baby's chart. Lactation Consultation Note  Patient Name: Julia Lynn ItoHeidi Beshara ZOXWR'UToday's Date: 05/19/2016 Reason for consult: Initial assessment;NICU baby   Initial consult with Exp BF mom of 26 hour old infant. Infant born at 40w and is currently in NICU. Mom reports she is pumping every 3 hours and getting small amounts of colostrum to take to infant. Colostrum is being used for mouth care. Infant with increased respiratory rate and has been NPO.   Hand Expression Handout, Breastfeeding Resources Handout, and Providing Milk for Your Baby in NICU Booklet given. Reviewed pumping every 2-3 hours for 15 minutes on Initiate setting with DEBP followed by hand expression. Mom was able to hand express easily. Discussed being able to take a 4-5 hour break at night from pumping and milk storage for the NICU infant. Mom has been to NICU and has been able to hold infant STS.   [facility] given, informed mom of IP/OP Services, BF Support Groups and LC Phone #. Enc mom to call with questions/concerns. Mom has a Medela PIS for home use, she was informed of pumps/pmping rooms in NICU. Follow up tomorrow.   Maternal Data Formula Feeding for Exclusion: No Has patient been taught Hand Expression?: Yes Does the patient have breastfeeding experience prior to this delivery?: Yes  Feeding    LATCH Score/Interventions                      Lactation Tools Discussed/Used WIC Program: No Pump Review: Setup, frequency, and cleaning;Milk Storage Initiated by:: Reviewed with mom   Consult Status Consult Status: Follow-up Date: 05/20/16 Follow-up type: In-patient    Silas FloodSharon S Hice 05/19/2016, 12:14 PM

## 2016-05-20 NOTE — Progress Notes (Signed)
Subjective: Postpartum Day 2: Cesarean Delivery Patient reports tolerating PO and no problems voiding.  Baby in NICU on antibiotics for tachypnea, decreasing O2 requirement, not yet feeding  Objective: Vital signs in last 24 hours: Temp:  [97.5 F (36.4 C)-97.9 F (36.6 C)] 97.9 F (36.6 C) (06/09 0504) Pulse Rate:  [64-76] 76 (06/09 0504) Resp:  [16-18] 16 (06/09 0504) BP: (101-109)/(51-74) 104/51 mmHg (06/09 0504) SpO2:  [100 %] 100 % (06/08 1025)  Physical Exam:  General: alert and cooperative Lochia: appropriate Uterine Fundus: firm Incision: C/D/I    Recent Labs  05/17/16 1410 05/19/16 0543  HGB 11.6* 9.2*  HCT 34.3* 27.1*    Assessment/Plan: Status post Cesarean section. Doing well postoperatively.  Continue current care.  Oliver PilaRICHARDSON,Grettell Ransdell W 05/20/2016, 9:13 AM

## 2016-05-20 NOTE — Progress Notes (Signed)
CSW acknowledges NICU admission.    Patient screened out for psychosocial assessment since none of the following apply:  Psychosocial stressors documented in mother or baby's chart  Gestation less than 32 weeks  Code at delivery   Infant with anomalies  Please contact the Clinical Social Worker if specific needs arise, or by MOB's request.       

## 2016-05-20 NOTE — Lactation Note (Signed)
This note was copied from a baby's chart. Lactation Consultation Note  Follow up visit made.  Mom is pumping every 3 hours and obtaining about 15 mls each breast.  Baby started feeding today.  Instructed to change pump settings when obtaining 20 mls or more.  Questions answered.  Encouraged to call with concerns prn.  Patient Name: Julia Bell     Maternal Data    Feeding    LATCH Score/Interventions                      Lactation Tools Discussed/Used     Consult Status      Huston FoleyMOULDEN, Gibran Veselka S Bell, 3:53 PM

## 2016-05-21 MED ORDER — IBUPROFEN 600 MG PO TABS: 600.0000 mg | ORAL_TABLET | Freq: Four times a day (QID) | ORAL | Status: DC

## 2016-05-21 MED ORDER — OXYCODONE HCL 5 MG PO TABS
5.0000 mg | ORAL_TABLET | ORAL | Status: DC | PRN
Start: 2016-05-21 — End: 2017-10-06

## 2016-05-21 NOTE — Discharge Summary (Signed)
OB Discharge Summary     Patient Name: Julia Bell DOB: 1986/07/23 MRN: [id number]  Date of admission: 05/18/2016 Delivering MD: Huel Cote   Date of discharge: 05/21/2016  Admitting diagnosis: repeat c-section Intrauterine pregnancy: [redacted]w[redacted]d     Secondary diagnosis:  Active Problems:   S/P repeat low transverse C-section  Additional problems: none     Discharge diagnosis: Term Pregnancy Delivered                                                                                                Post partum procedures:none    Complications: None baby to NICU for respiratory tachypnea  Hospital course:  Sceduled C/S   30 y.o. yo G3P2002 at [redacted]w[redacted]d was admitted to the hospital 05/18/2016 for scheduled cesarean section with the following indication:Elective Repeat.  Membrane Rupture Time/Date: 9:38 AM ,05/18/2016   Patient delivered a Viable infant.05/18/2016  Details of operation can be found in separate operative note.  Pateint had an uncomplicated postpartum course.  She is ambulating, tolerating a regular diet, passing flatus, and urinating well. Patient is discharged home in stable condition on  05/21/2016          Physical exam  Filed Vitals:   05/19/16 1821 05/20/16 0504 05/20/16 1835 05/21/16 0548  BP: 101/74 104/51 109/68 120/64  Pulse: 64 76 55 75  Temp: 97.5 F (36.4 C) 97.9 F (36.6 C) 97.5 F (36.4 C) 98.5 F (36.9 C)  TempSrc: Oral Oral Axillary Oral  Resp: SpO2:       General: alert and cooperative Lochia: appropriate Uterine Fundus: firm Incision: C/D/I  Labs: Lab Results  Component Value Date   WBC 8.6 05/19/2016   HGB 9.2* 05/19/2016   HCT 27.1* 05/19/2016   MCV 83.6 05/19/2016   PLT 131* 05/19/2016   No flowsheet data found.  Discharge instruction: per After Visit Summary and "Baby and Me Booklet".  After visit meds:    Medication List    STOP taking these medications        calcium carbonate 500 MG chewable tablet   Commonly known as:  TUMS - dosed in mg elemental calcium      TAKE these medications        ibuprofen 600 MG tablet  Commonly known as:  ADVIL,MOTRIN  Take 1 tablet (600 mg total) by mouth every 6 (six) hours.     oxyCODONE 5 MG immediate release tablet  Commonly known as:  Oxy IR/ROXICODONE  Take 1 tablet (5 mg total) by mouth every 4 (four) hours as needed (pain scale 4-7).     prenatal multivitamin Tabs tablet  Take 1 tablet by mouth daily at 12 noon.        Diet: routine diet  Activity: Advance as tolerated. Pelvic rest for 6 weeks.   Outpatient follow up:2 weeks Follow up Appt:No future appointments. Follow up Visit:No Follow-up on file.  Postpartum contraception: Undecided  Newborn Data: Live born female  Birth Weight: 7 lb 6 oz (3345 g) APGAR: 8, 9  Baby Feeding: Breast Disposition:NICU for antibiotics, stable off  O2   05/21/2016 Oliver PilaICHARDSON,Emilio Baylock W, MD

## 2016-05-21 NOTE — Progress Notes (Signed)
Subjective: Postpartum Day 3 Cesarean Delivery Patient reports tolerating PO and no problems voiding.    Objective: Vital signs in last 24 hours: Temp:  [97.5 F (36.4 C)-98.5 F (36.9 C)] 98.5 F (36.9 C) (06/10 0548) Pulse Rate:  [55-75] 75 (06/10 0548) Resp:  [18] 18 (06/10 0548) BP: (109-120)/(64-68) 120/64 mmHg (06/10 0548)  Physical Exam:  General: alert and cooperative Lochia: appropriate Uterine Fundus: firm Incision: C/D/I    Recent Labs  05/19/16 0543  HGB 9.2*  HCT 27.1*    Assessment/Plan: Status post Cesarean section. Doing well postoperatively.  Discharge home with standard precautions and return to office in 2 weeks.  Oliver PilaICHARDSON,Kyona Chauncey W 05/21/2016, 11:05 AM

## 2016-05-23 ENCOUNTER — Ambulatory Visit: Payer: Self-pay

## 2016-05-23 NOTE — Lactation Note (Signed)
This note was copied from a baby's chart. Lactation Consultation Note  Follow up visit made.  Mom at baby's bedside pumping.  She is obtaining 90+ mls from each breast.  She denies breast pain or discomfort.  Mom states she is also putting baby to breast and baby is latching well.  Encouraged to call with concerns prn.  Patient Name: Julia Lynn ItoHeidi Bell ZOXWR'UToday's Date: 05/23/2016     Maternal Data    Feeding Feeding Type: Breast Milk Nipple Type: Regular Length of feed: 20 min  LATCH Score/Interventions                      Lactation Tools Discussed/Used     Consult Status      Huston FoleyMOULDEN, Lorelie Biermann S 05/23/2016, 11:36 AM

## 2016-05-25 ENCOUNTER — Ambulatory Visit: Payer: Self-pay

## 2016-05-25 NOTE — Lactation Note (Signed)
This note was copied from a baby's chart. Lactation Consultation Note  Follow up visit made.  Parents roomed in with baby last night and will go home today.  Mom states baby is latching and breastfeeding well.  Parents sometimes top baby off with expressed milk in bottle.  Lactation outpatient services and support reviewed and encouraged.  Patient Name: Julia Lynn ItoHeidi Ode WUJWJ'XToday's Date: 05/25/2016     Maternal Data    Feeding Feeding Type: Breast Fed Length of feed: 25 min  LATCH Score/Interventions                      Lactation Tools Discussed/Used     Consult Status      Huston FoleyMOULDEN, Makya Phillis S 05/25/2016, 9:49 AM

## 2017-03-16 LAB — OB RESULTS CONSOLE GC/CHLAMYDIA
CHLAMYDIA, DNA PROBE: NEGATIVE
GC PROBE AMP, GENITAL: NEGATIVE

## 2017-03-16 LAB — OB RESULTS CONSOLE ABO/RH: RH Type: POSITIVE

## 2017-03-16 LAB — OB RESULTS CONSOLE ANTIBODY SCREEN: ANTIBODY SCREEN: NEGATIVE

## 2017-03-16 LAB — OB RESULTS CONSOLE RUBELLA ANTIBODY, IGM: Rubella: IMMUNE

## 2017-03-16 LAB — OB RESULTS CONSOLE HIV ANTIBODY (ROUTINE TESTING): HIV: NONREACTIVE

## 2017-03-16 LAB — OB RESULTS CONSOLE HEPATITIS B SURFACE ANTIGEN: Hepatitis B Surface Ag: NEGATIVE

## 2017-03-16 LAB — OB RESULTS CONSOLE RPR: RPR: NONREACTIVE

## 2017-06-16 IMAGING — US US OB TRANSVAGINAL
1 series · 14 of 28 positions shown · non-contrast
Comparison: None.

CLINICAL DATA: Vaginal bleeding. Estimated gestational age 9 weeks
2 days per LMP.

EXAM:
OBSTETRIC <14 WK US AND TRANSVAGINAL OB US
TECHNIQUE: Both transabdominal and transvaginal ultrasound examinations were
performed for complete evaluation of the gestation as well as the
maternal uterus, adnexal regions, and pelvic cul-de-sac.
Transvaginal technique was performed to assess early pregnancy.

[Series 1: us ob comp less 14 wk · 14 of 37 slices shown]
[im 2/37]
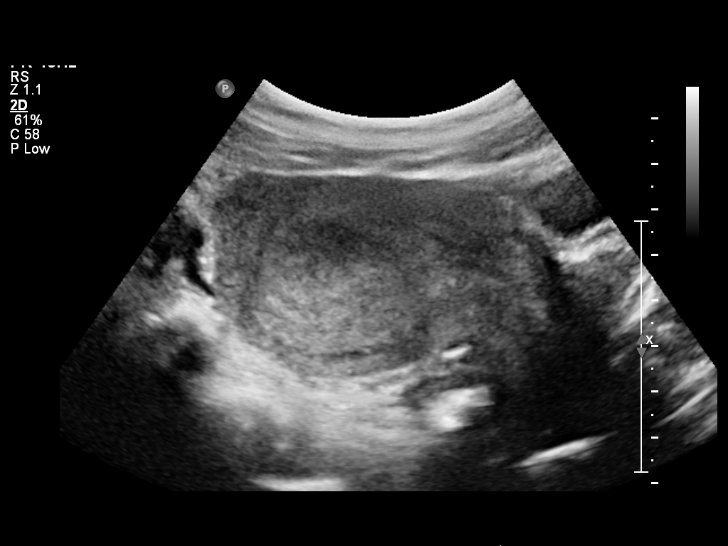
[im 5/37]
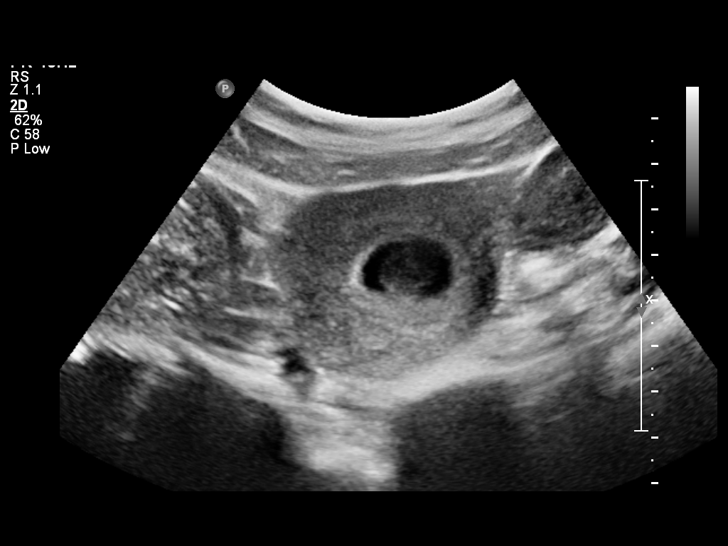
[im 7/37]
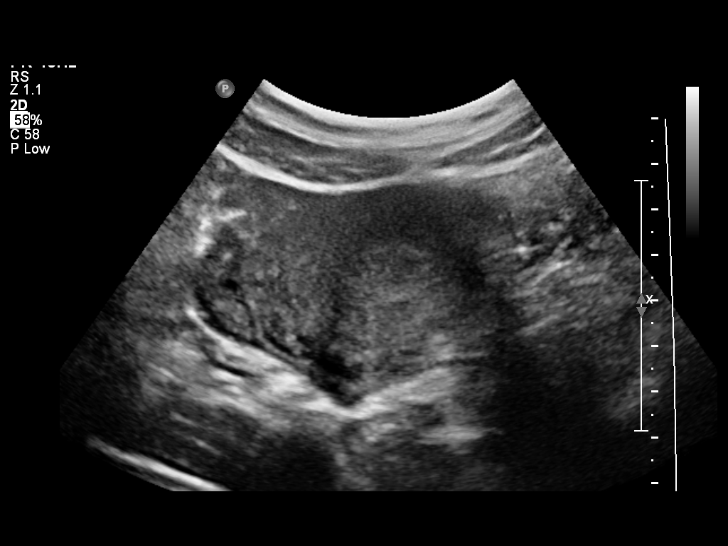
[im 10/37]
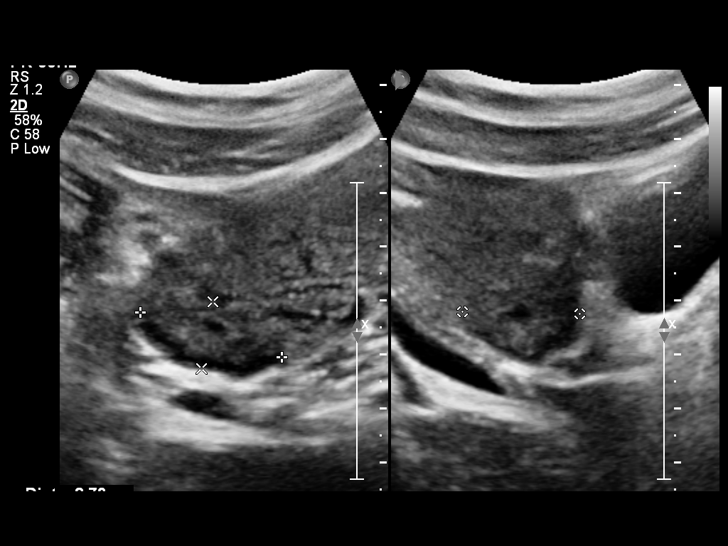
[im 13/37]
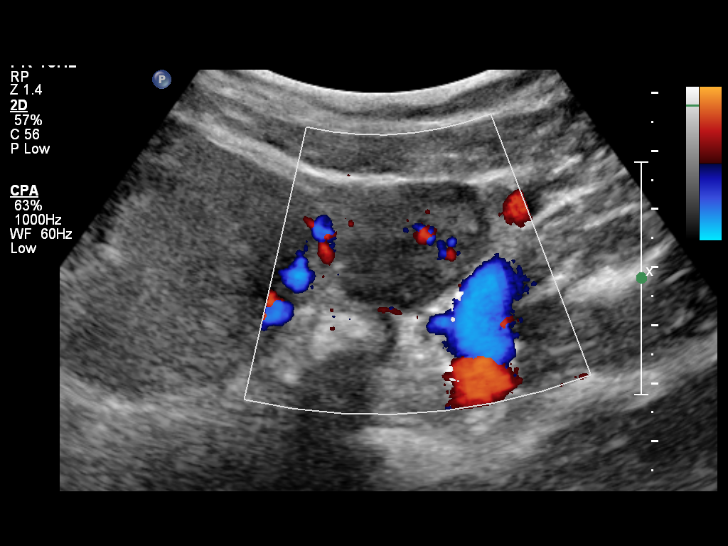
[im 15/37]
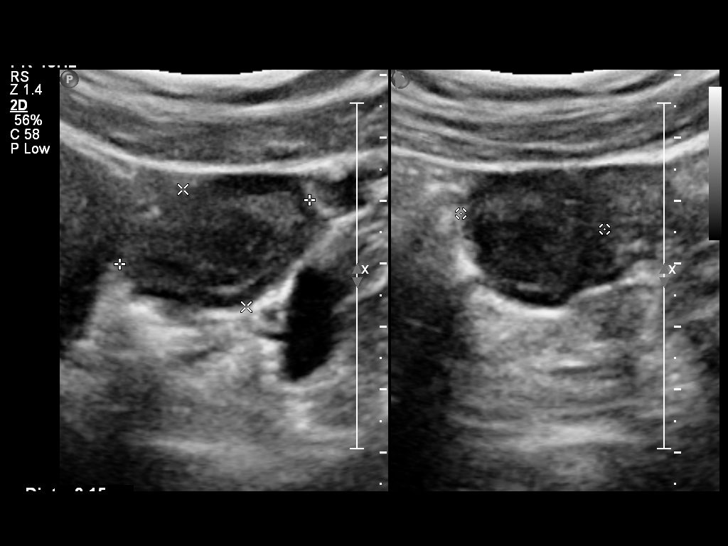
[im 18/37]
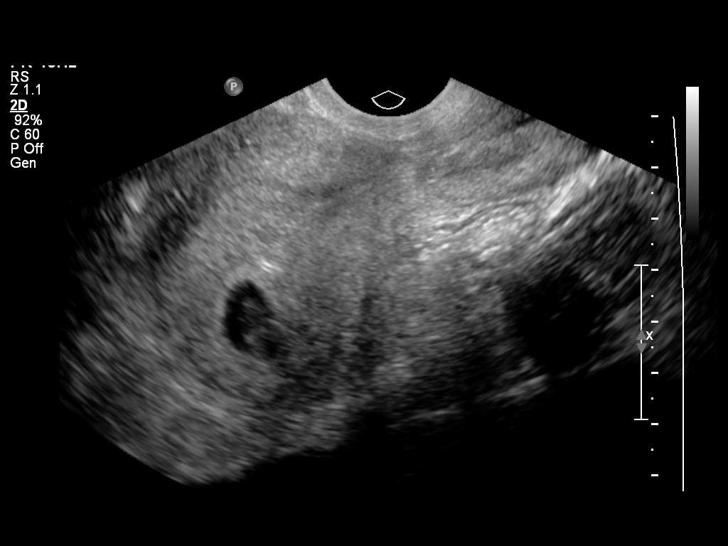
[im 21/37]
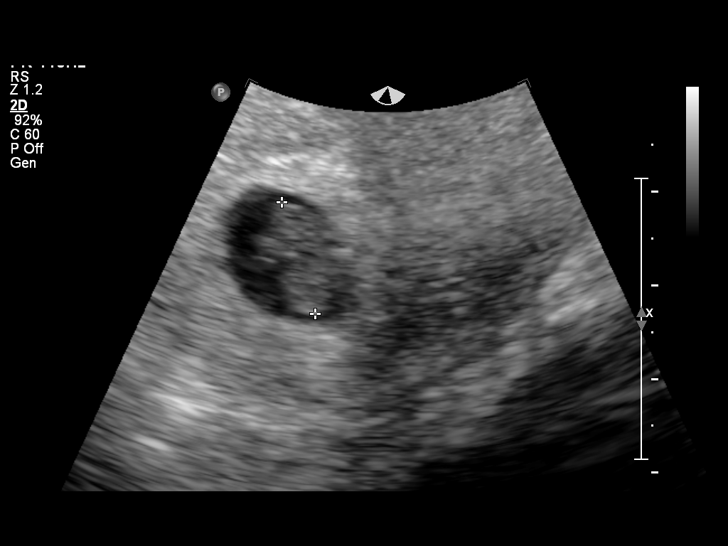
[im 23/37]
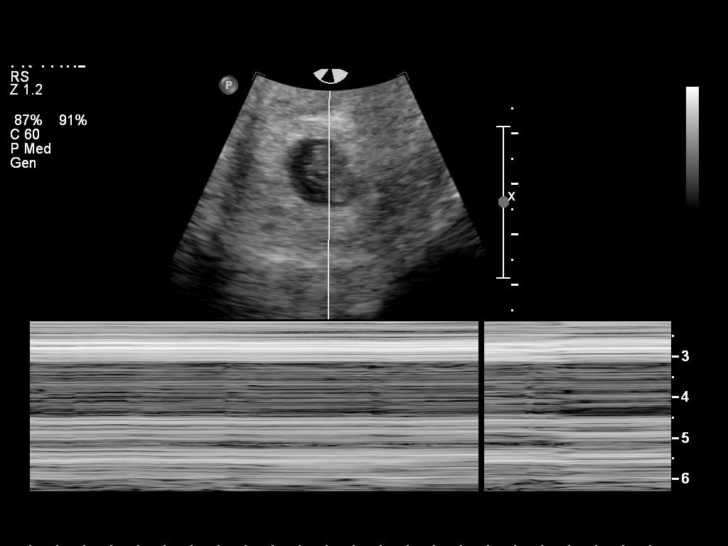
[im 26/37]
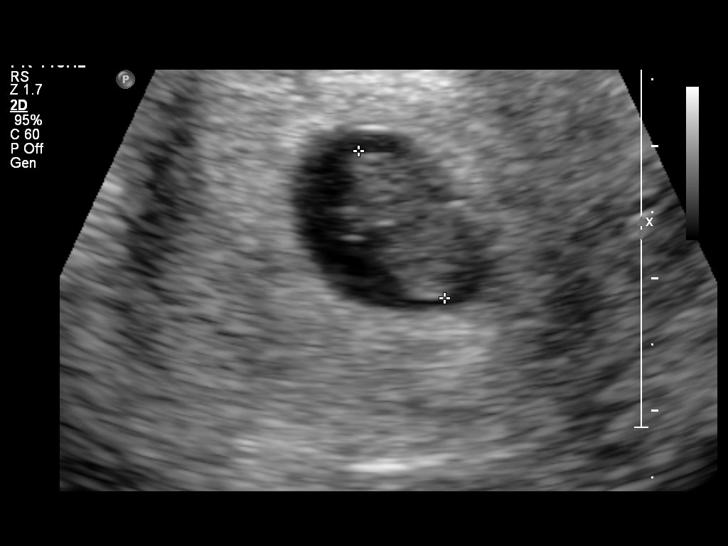
[im 29/37]
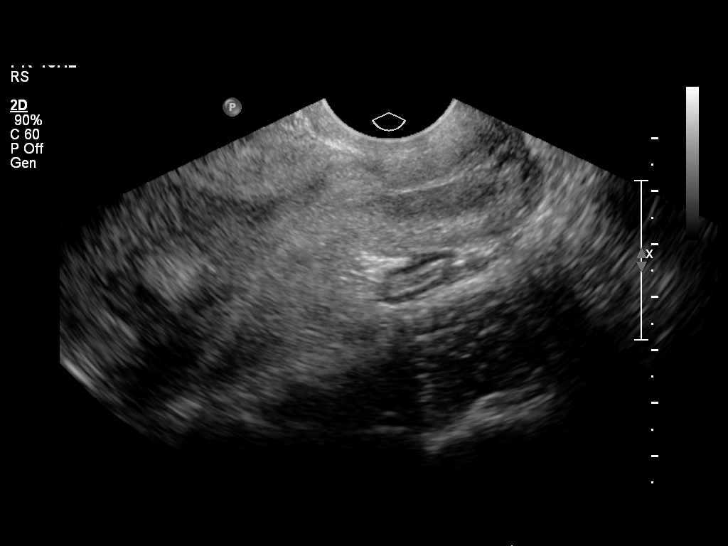
[im 31/37]
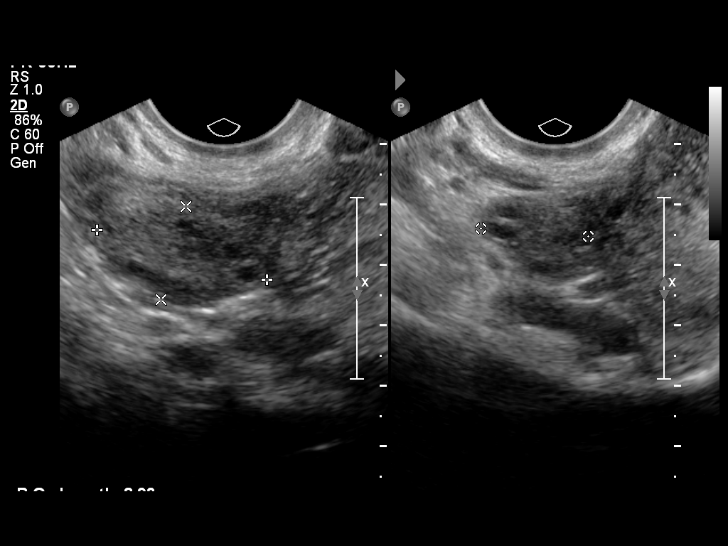
[im 34/37]
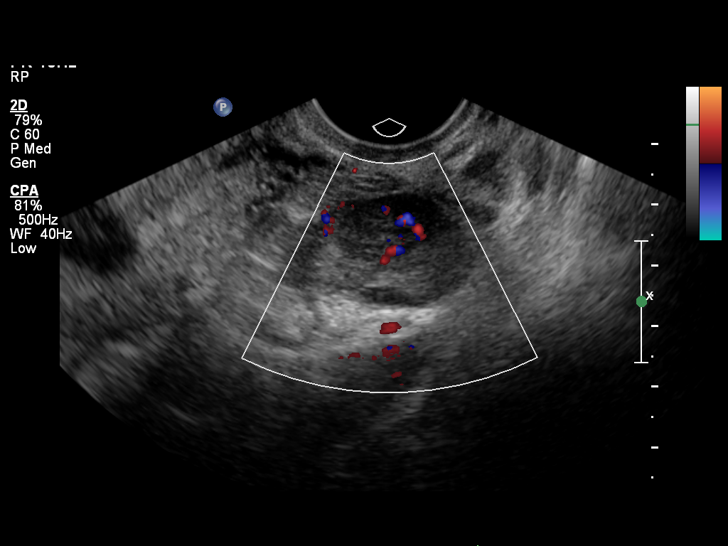
[im 37/37]
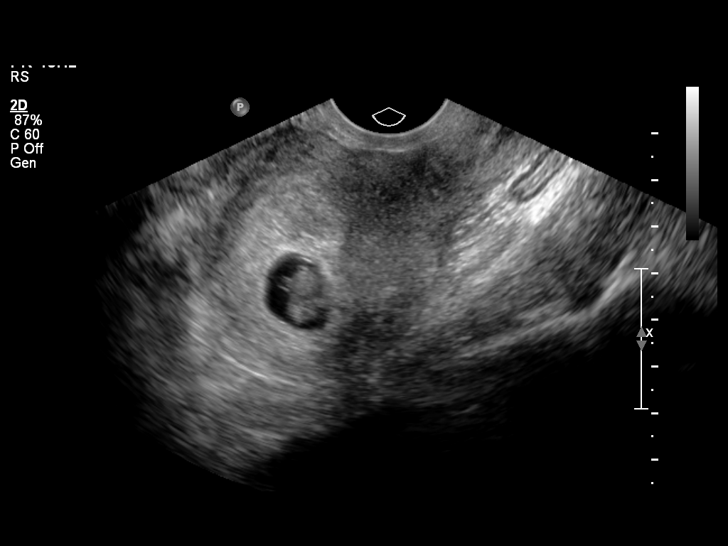

[14 of 28 positions shown; findings below may reference images not displayed]

FINDINGS: Intrauterine gestational sac: Visualized/normal in shape.

Yolk sac:  Visualized.

Embryo:  Visualized.

Cardiac Activity: Not visualized.

Heart Rate: Not visualized.

CRL:  12.7  mm   7 w   4 d

No evidence of subchorionic hemorrhage.

Maternal uterus/adnexae: Ovaries are within normal. Suggestion of
small corpus luteum over the left ovary. No free pelvic fluid.
IMPRESSION: IUP with crown-rump length of 12.7 mm and no cardiac activity/ heart
rate detected as findings are definitive for failed pregnancy.

## 2017-09-21 LAB — OB RESULTS CONSOLE GBS: STREP GROUP B AG: NEGATIVE

## 2017-09-26 ENCOUNTER — Encounter (HOSPITAL_COMMUNITY): Payer: Self-pay

## 2017-09-26 ENCOUNTER — Inpatient Hospital Stay (HOSPITAL_COMMUNITY)
Admission: AD | Admit: 2017-09-26 | Discharge: 2017-09-26 | Disposition: A | Discharge status: Home / Self Care | Payer: Commercial Managed Care - PPO | Source: Ambulatory Visit | Attending: Obstetrics and Gynecology | Admitting: Obstetrics and Gynecology

## 2017-09-26 DIAGNOSIS — O479 False labor, unspecified: Secondary | ICD-10-CM

## 2017-09-26 DIAGNOSIS — Z3A36 36 weeks gestation of pregnancy: Secondary | ICD-10-CM | POA: Insufficient documentation

## 2017-09-26 DIAGNOSIS — O4703 False labor before 37 completed weeks of gestation, third trimester: Secondary | ICD-10-CM | POA: Insufficient documentation

## 2017-09-26 LAB — WET PREP, GENITAL
Clue Cells Wet Prep HPF POC: NONE SEEN
SPERM: NONE SEEN
Trich, Wet Prep: NONE SEEN
Yeast Wet Prep HPF POC: NONE SEEN

## 2017-09-26 MED ORDER — LACTATED RINGERS IV BOLUS (SEPSIS)
1000.0000 mL | Freq: Once | INTRAVENOUS | Status: AC
  Administered 2017-09-26: 1000 mL via INTRAVENOUS

## 2017-09-26 MED ORDER — LIDOCAINE HCL (PF) 1 % IJ SOLN
INTRAMUSCULAR | Status: AC
  Filled 2017-09-26: qty 30

## 2017-09-26 MED ORDER — OXYTOCIN 10 UNIT/ML IJ SOLN
INTRAMUSCULAR | Status: AC
  Filled 2017-09-26: qty 1

## 2017-09-26 NOTE — Discharge Instructions (Signed)
Return with painful contractions or rupture of membranes or decreased fetal movements; keep office appointments

## 2017-09-26 NOTE — MAU Note (Signed)
Patient presents with ctx since 7 last night.  When timing them earlier states they were <5 minutes apart.  Scheduled repeat c-section for 11/5.  Reports good FM, no VB/LOF.

## 2017-09-26 NOTE — MAU Note (Signed)
I have communicated with Dr. Mindi Slicker and reviewed vital signs:  Vitals:   09/26/17 0414 09/26/17 0416  BP: 120/79   Pulse: (!) 58   Resp: 17   Temp:  98.5 F (36.9 C)    Vaginal exam:  Dilation: Closed Effacement (%): Thick Cervical Position: Posterior Exam by:: Latricia Heft RN,   Also reviewed contraction pattern and that non-stress test is reactive.  It has been documented that patient is contracting irregularly with no cervical change over 1.5 hours not indicating active labor.  Patient denies any other complaints.  Based on this report provider has given order for discharge.  A discharge order and diagnosis entered by a provider.   Labor discharge instructions reviewed with patient.

## 2017-09-26 NOTE — MAU Provider Note (Signed)
Pt with contractions. Cervix closed and unchanged after 2 hours. Received IVF in MAU. Declines pain medication while being monitored. Hx c/s x 2 = for repeat VSS CAT 1, 120s  A/P: [id number] at 54 3/7wks with false labor         Discharge to home with labor precautions         Keep scheduled appointments in office

## 2017-10-06 ENCOUNTER — Encounter (HOSPITAL_COMMUNITY): Payer: Self-pay

## 2017-10-13 ENCOUNTER — Encounter (HOSPITAL_COMMUNITY)
Admission: RE | Admit: 2017-10-13 | Discharge: 2017-10-13 | Disposition: A | Discharge status: Home / Self Care | Payer: Self-pay | Source: Ambulatory Visit | Attending: Obstetrics and Gynecology | Admitting: Obstetrics and Gynecology

## 2017-10-13 DIAGNOSIS — O34219 Maternal care for unspecified type scar from previous cesarean delivery: Secondary | ICD-10-CM | POA: Insufficient documentation

## 2017-10-13 DIAGNOSIS — Z01812 Encounter for preprocedural laboratory examination: Secondary | ICD-10-CM | POA: Insufficient documentation

## 2017-10-13 LAB — CBC
HEMATOCRIT: 34.2 % — AB (ref 36.0–46.0)
HEMOGLOBIN: 12 g/dL (ref 12.0–15.0)
MCH: 29.3 pg (ref 26.0–34.0)
MCHC: 35.1 g/dL (ref 30.0–36.0)
MCV: 83.4 fL (ref 78.0–100.0)
Platelets: 196 10*3/uL (ref 150–400)
RBC: 4.1 MIL/uL (ref 3.87–5.11)
RDW: 12.9 % (ref 11.5–15.5)
WBC: 9.3 10*3/uL (ref 4.0–10.5)

## 2017-10-13 LAB — TYPE AND SCREEN
ABO/RH(D): O POS
ANTIBODY SCREEN: NEGATIVE

## 2017-10-13 NOTE — Patient Instructions (Signed)
Gabriel CarinaHeidi K Aguinaldo  10/13/2017   Your procedure is scheduled on:  10/16/2017  [facility]tr[facility]30 AM.  Pick up the phone at the desk and dial [phone]  Call this number if you have problems the morning of [phone]  Remember:   Do not eat food:After Midnight.  Do not drink clear liquids: After Midnight.  Take these medicines the morning of surgery with A SIP OF WATER: none   Do not wear jewelry, make-up or nail polish.  Do not wear lotions, powders, or perfumes. Do not wear deodorant.  Do not shave 48 hours prior to surgery.  Do not bring valuables to the hospital.  [facility] is not   responsible for any belongings or valuables brought to the hospital.  Contacts, dentures or bridgework may not be worn into surgery.  Leave suitcase in the car. After surgery it may be brought to your room.  For patients admitted to the hospital, checkout time is 11:00 AM the day of              discharge.    N/A   Please read over the following fact sheets that you were given:   Surgical Site Infection Prevention

## 2017-10-14 LAB — RPR: RPR Ser Ql: NONREACTIVE

## 2017-10-15 ENCOUNTER — Inpatient Hospital Stay (HOSPITAL_COMMUNITY)
Admission: AD | Admit: 2017-10-15 | Discharge: 2017-10-18 | DRG: 788 | Disposition: A | Discharge status: Home / Self Care | Payer: Self-pay | Source: Ambulatory Visit | Attending: Obstetrics and Gynecology | Admitting: Obstetrics and Gynecology

## 2017-10-15 ENCOUNTER — Encounter (HOSPITAL_COMMUNITY): Payer: Self-pay | Admitting: *Deleted

## 2017-10-15 DIAGNOSIS — O9089 Other complications of the puerperium, not elsewhere classified: Secondary | ICD-10-CM | POA: Diagnosis not present

## 2017-10-15 DIAGNOSIS — R05 Cough: Secondary | ICD-10-CM | POA: Diagnosis not present

## 2017-10-15 DIAGNOSIS — Z98891 History of uterine scar from previous surgery: Secondary | ICD-10-CM

## 2017-10-15 DIAGNOSIS — Z3A39 39 weeks gestation of pregnancy: Secondary | ICD-10-CM

## 2017-10-15 DIAGNOSIS — O34211 Maternal care for low transverse scar from previous cesarean delivery: Principal | ICD-10-CM | POA: Diagnosis present

## 2017-10-15 LAB — POCT FERN TEST: POCT Fern Test: POSITIVE

## 2017-10-15 MED ORDER — LACTATED RINGERS IV SOLN
INTRAVENOUS | Status: DC
  Administered 2017-10-15 – 2017-10-16 (×2): via INTRAVENOUS

## 2017-10-15 MED ORDER — DEXTROSE 5 % IV SOLN
2.0000 g | INTRAVENOUS | Status: AC
  Filled 2017-10-15: qty 2

## 2017-10-15 MED ORDER — SOD CITRATE-CITRIC ACID 500-334 MG/5ML PO SOLN
30.0000 mL | Freq: Once | ORAL | Status: AC
  Administered 2017-10-16: 30 mL via ORAL
  Filled 2017-10-15: qty 15

## 2017-10-15 MED ORDER — DEXTROSE 5 % IV SOLN: 2.0000 g | INTRAVENOUS | Status: DC

## 2017-10-15 NOTE — H&P (Signed)
Gabriel CarinaHeidi K Coil is a 31 y.o. female [id number] at 8439 2/7 weeks (EDD 10/21/17 by LMP c/w 7 week US) presenting for scheduled repeat c-section. Prenatal care significant for only the prior LTCS x 2 and last baby had some pulmonary hypertension post-delivery that resolved.    OB History    Gravida Para Term Preterm AB Living   4 2 2   1 2    SAB TAB Ectopic Multiple Live Births   1     0 2    2014 LTCS  2017 LTCS SAB x 1  Past Medical History:  Diagnosis Date  . Medical history non-contributory    Past Surgical History:  Procedure Laterality Date  . BREAST SURGERY     benign cyst drained  . NO PAST SURGERIES     Family History: family history includes Diabetes in her maternal grandfather; Heart attack in her maternal grandfather. Social History:  reports that  has never smoked. she has never used smokeless tobacco. She reports that she does not drink alcohol or use drugs.     Maternal Diabetes: No Genetic Screening: Declined Maternal Ultrasounds/Referrals: Normal Fetal Ultrasounds or other Referrals:  None Maternal Substance Abuse:  No Significant Maternal Medications:  None Significant Maternal Lab Results:  None Other Comments:  None  Review of Systems  Gastrointestinal: Negative for abdominal pain.  Neurological: Negative for headaches.   Maternal Medical History:  Contractions: Frequency: rare.   Perceived severity is mild.    Fetal activity: Perceived fetal activity is normal.    Prenatal complications: no prenatal complications Prenatal Complications - Diabetes: none.      unknown if currently breastfeeding. Maternal Exam:  Uterine Assessment: Contraction strength is mild.  Contraction frequency is rare.   Abdomen: Patient reports no abdominal tenderness. Surgical scars: low transverse.   Fetal presentation: vertex  Introitus: Normal vulva. Normal vagina.    Physical Exam  Constitutional: She appears well-developed.  Cardiovascular: Normal rate and regular  rhythm.  Respiratory: Effort normal.  GI: Soft.  Genitourinary: Vagina normal.  Musculoskeletal: Normal range of motion.  Neurological: She is alert.  Psychiatric: She has a normal mood and affect.    Prenatal labs: ABO, Rh: --/--/O POS (11/02 1025) Antibody: NEG (11/02 1025) Rubella: Immune (04/05 0000) RPR: Non Reactive (11/02 1025)  HBsAg: Negative (04/05 0000)  HIV: Non-reactive (04/05 0000)  GBS: Negative (10/11 0000)  One hour GCT  Assessment/Plan: Risks and benefits of c-section reviewed with patient in detail including risk of bleeding, infection, possible damage to bowel and bladder.  Pt ready to proceed.  Declines concurrent BTL.  Oliver PilaKathy W Jones Viviani 10/15/2017, 2:39 PM

## 2017-10-15 NOTE — H&P (Deleted)
  The note originally documented on this encounter has been moved the the encounter in which it belongs.  

## 2017-10-15 NOTE — MAU Note (Signed)
Pt states water broke at 9:30-clear fluid. Pt reports contractions every 10-39mins. Reports good fetal movement. Pt is scheduled for repeat c/s tomorrow morning.

## 2017-10-16 ENCOUNTER — Inpatient Hospital Stay (HOSPITAL_COMMUNITY): Payer: Self-pay | Admitting: Anesthesiology

## 2017-10-16 ENCOUNTER — Encounter (HOSPITAL_COMMUNITY): Payer: Self-pay

## 2017-10-16 ENCOUNTER — Inpatient Hospital Stay (HOSPITAL_COMMUNITY): Admission: RE | Admit: 2017-10-16 | Payer: Self-pay | Source: Ambulatory Visit | Admitting: Obstetrics and Gynecology

## 2017-10-16 ENCOUNTER — Encounter (HOSPITAL_COMMUNITY)
Admission: AD | Disposition: A | Discharge status: Home / Self Care | Payer: Self-pay | Source: Ambulatory Visit | Attending: Obstetrics and Gynecology

## 2017-10-16 ENCOUNTER — Other Ambulatory Visit: Payer: Self-pay

## 2017-10-16 LAB — CBC
HCT: 33.8 % — ABNORMAL LOW (ref 36.0–46.0)
HEMOGLOBIN: 11.6 g/dL — AB (ref 12.0–15.0)
MCH: 28.8 pg (ref 26.0–34.0)
MCHC: 34.3 g/dL (ref 30.0–36.0)
MCV: 83.9 fL (ref 78.0–100.0)
PLATELETS: 188 10*3/uL (ref 150–400)
RBC: 4.03 MIL/uL (ref 3.87–5.11)
RDW: 13.1 % (ref 11.5–15.5)
WBC: 12.1 10*3/uL — AB (ref 4.0–10.5)

## 2017-10-16 SURGERY — Surgical Case
Anesthesia: Spinal

## 2017-10-16 MED ORDER — OXYCODONE HCL 5 MG/5ML PO SOLN: 5.0000 mg | Freq: Once | ORAL | Status: DC | PRN

## 2017-10-16 MED ORDER — DIPHENHYDRAMINE HCL 50 MG/ML IJ SOLN: 12.5000 mg | INTRAMUSCULAR | Status: DC | PRN

## 2017-10-16 MED ORDER — MORPHINE SULFATE (PF) 0.5 MG/ML IJ SOLN
INTRAMUSCULAR | Status: AC
  Filled 2017-10-16: qty 10

## 2017-10-16 MED ORDER — PROMETHAZINE HCL 25 MG/ML IJ SOLN: 6.2500 mg | INTRAMUSCULAR | Status: DC | PRN

## 2017-10-16 MED ORDER — SIMETHICONE 80 MG PO CHEW
80.0000 mg | CHEWABLE_TABLET | ORAL | Status: DC | PRN
  Filled 2017-10-16: qty 1

## 2017-10-16 MED ORDER — MEPERIDINE HCL 25 MG/ML IJ SOLN: 6.2500 mg | INTRAMUSCULAR | Status: DC | PRN

## 2017-10-16 MED ORDER — MENTHOL 3 MG MT LOZG
1.0000 | LOZENGE | OROMUCOSAL | Status: DC | PRN
  Filled 2017-10-16: qty 9

## 2017-10-16 MED ORDER — LACTATED RINGERS IV SOLN
INTRAVENOUS | Status: DC
  Administered 2017-10-16: 11:00:00 via INTRAVENOUS

## 2017-10-16 MED ORDER — ZOLPIDEM TARTRATE 5 MG PO TABS: 5.0000 mg | ORAL_TABLET | Freq: Every evening | ORAL | Status: DC | PRN

## 2017-10-16 MED ORDER — OXYTOCIN 40 UNITS IN LACTATED RINGERS INFUSION - SIMPLE MED: 2.5000 [IU]/h | INTRAVENOUS | Status: AC

## 2017-10-16 MED ORDER — SODIUM CHLORIDE 0.9% FLUSH: 3.0000 mL | INTRAVENOUS | Status: DC | PRN

## 2017-10-16 MED ORDER — SCOPOLAMINE 1 MG/3DAYS TD PT72: 1.0000 | MEDICATED_PATCH | Freq: Once | TRANSDERMAL | Status: DC

## 2017-10-16 MED ORDER — ONDANSETRON HCL 4 MG/2ML IJ SOLN: 4.0000 mg | Freq: Three times a day (TID) | INTRAMUSCULAR | Status: DC | PRN

## 2017-10-16 MED ORDER — OXYCODONE HCL 5 MG PO TABS: 10.0000 mg | ORAL_TABLET | ORAL | Status: DC | PRN

## 2017-10-16 MED ORDER — NALBUPHINE HCL 10 MG/ML IJ SOLN: 5.0000 mg | Freq: Once | INTRAMUSCULAR | Status: DC | PRN

## 2017-10-16 MED ORDER — SCOPOLAMINE 1 MG/3DAYS TD PT72
MEDICATED_PATCH | TRANSDERMAL | Status: AC
  Filled 2017-10-16: qty 1

## 2017-10-16 MED ORDER — PRENATAL MULTIVITAMIN CH
1.0000 | ORAL_TABLET | Freq: Every day | ORAL | Status: DC
  Administered 2017-10-16 – 2017-10-17 (×2): 1 via ORAL
  Filled 2017-10-16 (×4): qty 1

## 2017-10-16 MED ORDER — LACTATED RINGERS IV SOLN
INTRAVENOUS | Status: DC | PRN
  Administered 2017-10-16 (×2): via INTRAVENOUS

## 2017-10-16 MED ORDER — COCONUT OIL OIL
1.0000 "application " | TOPICAL_OIL | Status: DC | PRN
  Administered 2017-10-18: 1 via TOPICAL
  Filled 2017-10-16 (×2): qty 120

## 2017-10-16 MED ORDER — HYDROMORPHONE HCL 1 MG/ML IJ SOLN: 0.2500 mg | INTRAMUSCULAR | Status: DC | PRN

## 2017-10-16 MED ORDER — TETANUS-DIPHTH-ACELL PERTUSSIS 5-2.5-18.5 LF-MCG/0.5 IM SUSP
0.5000 mL | Freq: Once | INTRAMUSCULAR | Status: DC
  Filled 2017-10-16: qty 0.5

## 2017-10-16 MED ORDER — PHENYLEPHRINE 8 MG IN D5W 100 ML (0.08MG/ML) PREMIX OPTIME
INJECTION | INTRAVENOUS | Status: AC
  Filled 2017-10-16: qty 100

## 2017-10-16 MED ORDER — SENNOSIDES-DOCUSATE SODIUM 8.6-50 MG PO TABS
2.0000 | ORAL_TABLET | ORAL | Status: DC
  Administered 2017-10-16 – 2017-10-17 (×2): 2 via ORAL
  Filled 2017-10-16 (×4): qty 2

## 2017-10-16 MED ORDER — DIPHENHYDRAMINE HCL 25 MG PO CAPS
25.0000 mg | ORAL_CAPSULE | ORAL | Status: DC | PRN
  Filled 2017-10-16: qty 1

## 2017-10-16 MED ORDER — NALOXONE HCL 0.4 MG/ML IJ SOLN: 0.4000 mg | INTRAMUSCULAR | Status: DC | PRN

## 2017-10-16 MED ORDER — BUPIVACAINE IN DEXTROSE 0.75-8.25 % IT SOLN
INTRATHECAL | Status: DC | PRN
  Administered 2017-10-16: 1.6 mg via INTRATHECAL

## 2017-10-16 MED ORDER — NALBUPHINE HCL 10 MG/ML IJ SOLN: 5.0000 mg | INTRAMUSCULAR | Status: DC | PRN

## 2017-10-16 MED ORDER — PHENYLEPHRINE 8 MG IN D5W 100 ML (0.08MG/ML) PREMIX OPTIME
INJECTION | INTRAVENOUS | Status: DC | PRN
  Administered 2017-10-16: 60 ug/min via INTRAVENOUS

## 2017-10-16 MED ORDER — CEFAZOLIN SODIUM-DEXTROSE 2-3 GM-%(50ML) IV SOLR
INTRAVENOUS | Status: AC
  Filled 2017-10-16: qty 50

## 2017-10-16 MED ORDER — AMOXICILLIN-POT CLAVULANATE 875-125 MG PO TABS
1.0000 | ORAL_TABLET | Freq: Two times a day (BID) | ORAL | Status: AC
  Administered 2017-10-16 – 2017-10-17 (×4): 1 via ORAL
  Filled 2017-10-16 (×9): qty 1

## 2017-10-16 MED ORDER — OXYCODONE HCL 5 MG PO TABS
5.0000 mg | ORAL_TABLET | ORAL | Status: DC | PRN
  Administered 2017-10-17: 5 mg via ORAL
  Filled 2017-10-16: qty 1

## 2017-10-16 MED ORDER — ONDANSETRON HCL 4 MG/2ML IJ SOLN
INTRAMUSCULAR | Status: AC
  Filled 2017-10-16: qty 2

## 2017-10-16 MED ORDER — SCOPOLAMINE 1 MG/3DAYS TD PT72
MEDICATED_PATCH | TRANSDERMAL | Status: DC | PRN
  Administered 2017-10-16: 1 via TRANSDERMAL

## 2017-10-16 MED ORDER — MORPHINE SULFATE (PF) 0.5 MG/ML IJ SOLN
INTRAMUSCULAR | Status: DC | PRN
  Administered 2017-10-16: .2 mg via INTRATHECAL

## 2017-10-16 MED ORDER — SIMETHICONE 80 MG PO CHEW
80.0000 mg | CHEWABLE_TABLET | ORAL | Status: DC
  Administered 2017-10-16 – 2017-10-17 (×2): 80 mg via ORAL
  Filled 2017-10-16 (×4): qty 1

## 2017-10-16 MED ORDER — DEXTROSE 5 % IV SOLN
1.0000 ug/kg/h | INTRAVENOUS | Status: DC | PRN
  Filled 2017-10-16: qty 2

## 2017-10-16 MED ORDER — DIPHENHYDRAMINE HCL 25 MG PO CAPS
25.0000 mg | ORAL_CAPSULE | Freq: Four times a day (QID) | ORAL | Status: DC | PRN
  Filled 2017-10-16: qty 1

## 2017-10-16 MED ORDER — FENTANYL CITRATE (PF) 100 MCG/2ML IJ SOLN
INTRAMUSCULAR | Status: DC | PRN
  Administered 2017-10-16: 10 ug via INTRATHECAL

## 2017-10-16 MED ORDER — WITCH HAZEL-GLYCERIN EX PADS: 1.0000 "application " | MEDICATED_PAD | CUTANEOUS | Status: DC | PRN

## 2017-10-16 MED ORDER — FENTANYL CITRATE (PF) 100 MCG/2ML IJ SOLN
INTRAMUSCULAR | Status: AC
  Filled 2017-10-16: qty 2

## 2017-10-16 MED ORDER — CEFAZOLIN SODIUM-DEXTROSE 2-3 GM-%(50ML) IV SOLR
INTRAVENOUS | Status: DC | PRN
  Administered 2017-10-16: 2 g via INTRAVENOUS

## 2017-10-16 MED ORDER — GUAIFENESIN-CODEINE 100-10 MG/5ML PO SOLN
10.0000 mL | Freq: Four times a day (QID) | ORAL | Status: DC | PRN
  Administered 2017-10-16 – 2017-10-18 (×5): 10 mL via ORAL
  Filled 2017-10-16 (×5): qty 10

## 2017-10-16 MED ORDER — LACTATED RINGERS IV SOLN
INTRAVENOUS | Status: DC | PRN
  Administered 2017-10-16: 40 [IU] via INTRAVENOUS

## 2017-10-16 MED ORDER — SIMETHICONE 80 MG PO CHEW
80.0000 mg | CHEWABLE_TABLET | Freq: Three times a day (TID) | ORAL | Status: DC
  Administered 2017-10-16 – 2017-10-18 (×5): 80 mg via ORAL
  Filled 2017-10-16 (×12): qty 1

## 2017-10-16 MED ORDER — DIBUCAINE 1 % RE OINT
1.0000 "application " | TOPICAL_OINTMENT | RECTAL | Status: DC | PRN
  Filled 2017-10-16: qty 28

## 2017-10-16 MED ORDER — ONDANSETRON HCL 4 MG/2ML IJ SOLN
INTRAMUSCULAR | Status: DC | PRN
  Administered 2017-10-16: 4 mg via INTRAVENOUS

## 2017-10-16 MED ORDER — OXYCODONE HCL 5 MG PO TABS: 5.0000 mg | ORAL_TABLET | Freq: Once | ORAL | Status: DC | PRN

## 2017-10-16 MED ORDER — IBUPROFEN 600 MG PO TABS
600.0000 mg | ORAL_TABLET | Freq: Four times a day (QID) | ORAL | Status: DC
  Administered 2017-10-16 – 2017-10-18 (×8): 600 mg via ORAL
  Filled 2017-10-16 (×9): qty 1

## 2017-10-16 MED ORDER — ACETAMINOPHEN 325 MG PO TABS
650.0000 mg | ORAL_TABLET | ORAL | Status: DC | PRN
  Administered 2017-10-17 (×2): 650 mg via ORAL
  Filled 2017-10-16 (×2): qty 2

## 2017-10-16 MED ORDER — OXYTOCIN 10 UNIT/ML IJ SOLN
INTRAMUSCULAR | Status: AC
Start: 2017-10-16 — End: 2017-10-16
  Filled 2017-10-16: qty 4

## 2017-10-16 SURGICAL SUPPLY — 34 items
BENZOIN TINCTURE PRP APPL 2/3 (GAUZE/BANDAGES/DRESSINGS) ×3 IMPLANT
CHLORAPREP W/TINT 26ML (MISCELLANEOUS) ×3 IMPLANT
CLAMP CORD UMBIL (MISCELLANEOUS) IMPLANT
CLOSURE WOUND 1/2 X4 (GAUZE/BANDAGES/DRESSINGS) ×1
CLOTH BEACON ORANGE TIMEOUT ST (SAFETY) ×3 IMPLANT
DRSG OPSITE POSTOP 4X10 (GAUZE/BANDAGES/DRESSINGS) ×3 IMPLANT
ELECT REM PT RETURN 9FT ADLT (ELECTROSURGICAL) ×3
ELECTRODE REM PT RTRN 9FT ADLT (ELECTROSURGICAL) ×1 IMPLANT
EXTRACTOR VACUUM KIWI (MISCELLANEOUS) ×3 IMPLANT
GLOVE BIO SURGEON STRL SZ 6.5 (GLOVE) ×2 IMPLANT
GLOVE BIO SURGEONS STRL SZ 6.5 (GLOVE) ×1
GLOVE BIOGEL PI IND STRL 7.0 (GLOVE) ×1 IMPLANT
GLOVE BIOGEL PI INDICATOR 7.0 (GLOVE) ×2
GOWN STRL REUS W/TWL LRG LVL3 (GOWN DISPOSABLE) ×6 IMPLANT
KIT ABG SYR 3ML LUER SLIP (SYRINGE) IMPLANT
NEEDLE HYPO 25X5/8 SAFETYGLIDE (NEEDLE) IMPLANT
NS IRRIG 1000ML POUR BTL (IV SOLUTION) ×3 IMPLANT
PACK C SECTION WH (CUSTOM PROCEDURE TRAY) ×3 IMPLANT
PAD OB MATERNITY 4.3X12.25 (PERSONAL CARE ITEMS) ×3 IMPLANT
PENCIL SMOKE EVAC W/HOLSTER (ELECTROSURGICAL) ×3 IMPLANT
RTRCTR C-SECT PINK 25CM LRG (MISCELLANEOUS) ×3 IMPLANT
STRIP CLOSURE SKIN 1/2X4 (GAUZE/BANDAGES/DRESSINGS) ×2 IMPLANT
SUT CHROMIC 1 CTX 36 (SUTURE) ×6 IMPLANT
SUT PLAIN 0 NONE (SUTURE) IMPLANT
SUT PLAIN 2 0 XLH (SUTURE) ×3 IMPLANT
SUT VIC AB 0 CT1 27 (SUTURE) ×4
SUT VIC AB 0 CT1 27XBRD ANBCTR (SUTURE) ×2 IMPLANT
SUT VIC AB 2-0 CT1 27 (SUTURE) ×2
SUT VIC AB 2-0 CT1 TAPERPNT 27 (SUTURE) ×1 IMPLANT
SUT VIC AB 3-0 CT1 27 (SUTURE)
SUT VIC AB 3-0 CT1 TAPERPNT 27 (SUTURE) IMPLANT
SUT VIC AB 4-0 KS 27 (SUTURE) ×3 IMPLANT
TOWEL OR 17X24 6PK STRL BLUE (TOWEL DISPOSABLE) ×3 IMPLANT
TRAY FOLEY BAG SILVER LF 14FR (SET/KITS/TRAYS/PACK) ×3 IMPLANT

## 2017-10-16 NOTE — Anesthesia Postprocedure Evaluation (Signed)
Anesthesia Post Note  Patient: Julia Bell  Procedure(s) Performed: REPEAT CESAREAN SECTION (N/A )     Patient location during evaluation: Mother Baby Anesthesia Type: Spinal Level of consciousness: awake and alert and oriented Pain management: satisfactory to patient Vital Signs Assessment: post-procedure vital signs reviewed and stable Respiratory status: respiratory function stable and spontaneous breathing Cardiovascular status: blood pressure returned to baseline Postop Assessment: no headache, no backache, spinal receding, patient able to bend at knees and adequate PO intake Anesthetic complications: no    Last Vitals:  Vitals:   10/16/17 0600 10/16/17 0659  BP: (!) 100/52 112/60  Pulse: (!) 57 (!) 57  Resp: 20 20  Temp: 36.5 C 36.9 C  SpO2: 98% 98%    Last Pain:  Vitals:   10/16/17 0659  TempSrc:   PainSc: 0-No pain   Pain Goal:                 July Linam

## 2017-10-16 NOTE — Anesthesia Preprocedure Evaluation (Signed)
Anesthesia Evaluation  Patient identified by MRN, date of birth, ID band Patient awake    Reviewed: Allergy & Precautions, H&P , NPO status , Patient's Chart, lab work & pertinent test results  Airway Mallampati: I  TM Distance: >3 FB Neck ROM: full    Dental no notable dental hx.    Pulmonary neg pulmonary ROS,    Pulmonary exam normal        Cardiovascular negative cardio ROS Normal cardiovascular exam     Neuro/Psych negative neurological ROS  negative psych ROS   GI/Hepatic negative GI ROS, Neg liver ROS,   Endo/Other  negative endocrine ROS  Renal/GU negative Renal ROS     Musculoskeletal   Abdominal Normal abdominal exam  (+)   Peds  Hematology negative hematology ROS (+)   Anesthesia Other Findings   Reproductive/Obstetrics (+) Pregnancy                             Anesthesia Physical  Anesthesia Plan  ASA: II  Anesthesia Plan: Spinal   Post-op Pain Management:    Induction:   PONV Risk Score and Plan: 2 and Treatment may vary due to age or medical condition  Airway Management Planned: Natural Airway  Additional Equipment:   Intra-op Plan:   Post-operative Plan:   Informed Consent: I have reviewed the patients History and Physical, chart, labs and discussed the procedure including the risks, benefits and alternatives for the proposed anesthesia with the patient or authorized representative who has indicated his/her understanding and acceptance.     Plan Discussed with: CRNA and Surgeon  Anesthesia Plan Comments:         Anesthesia Quick Evaluation

## 2017-10-16 NOTE — Lactation Note (Signed)
This note was copied from a baby's chart. Lactation Consultation Note  Patient Name: Julia Lynn ItoHeidi Wehrenberg RUEAV'WToday's Date: 10/16/2017 Reason for consult: Initial assessment   Initial assessment with Exp BF mom of 12 hour old infant. Infant asleep in dad's arms. Mom reports she BF her 6618 month old for 10-11 months.   Infant with 3 BF for 10-60 minutes, 2 BF attempts, 1 void and 2 stools in last 24 hours. Infant weight 7 lb 10.2 oz. LATCH scores 5-8. Mom reports infant has been sleepy and she is using awakening techniques as needed. Enc mom to massage/compress breast with feeding. Mom aware to use pillows and blankets to support infant during feeds. Additional pillows given.   Mom requested manual pump and was given. Enc mom to feed infant STS 8-12 x in 24 hours at first feeding cues. Enc mom to hand express before feeding and to hand express and spoon feed if infant too sleepy to latch. Mom reports she knows how to hand express.  BF Resources handout and LC Brochure given, mom informed of IP/OP Services, BF Support Groups and LC phone #. Enc mom to call out for assistance as needed. Mom reports no questions/concerns at this time.    Maternal Data Formula Feeding for Exclusion: No Has patient been taught Hand Expression?: Yes Does the patient have breastfeeding experience prior to this delivery?: Yes  Feeding    LATCH Score                   Interventions Interventions: Breast feeding basics reviewed;Skin to skin;Position options;Expressed milk;Hand pump;Hand express  Lactation Tools Discussed/Used WIC Program: No   Consult Status Consult Status: Follow-up Date: 10/17/17 Follow-up type: In-patient    Silas FloodSharon S Estil Vallee 10/16/2017, 2:11 PM

## 2017-10-16 NOTE — Progress Notes (Signed)
Patient ID: Julia Bell, female   DOB: 02/11/1986, 31 y.o.   MRN: [id number] D/w parents circumcision and they desire to proceed.  Have paid at office.

## 2017-10-16 NOTE — Anesthesia Procedure Notes (Signed)
Spinal  Patient location during procedure: OB Start time: 10/16/2017 1:11 AM End time: 10/16/2017 1:16 AM Staffing Anesthesiologist: Lowella CurbMiller, Meshia Rau Ray, MD Performed: anesthesiologist  Preanesthetic Checklist Completed: patient identified, surgical consent, pre-op evaluation, timeout performed, IV checked, risks and benefits discussed and monitors and equipment checked Spinal Block Patient position: sitting Prep: site prepped and draped and DuraPrep Patient monitoring: heart rate, cardiac monitor, continuous pulse ox and blood pressure Approach: midline Location: L3-4 Injection technique: single-shot Needle Needle type: Pencan  Needle gauge: 24 G Needle length: 10 cm Assessment Sensory level: T4

## 2017-10-16 NOTE — Transfer of Care (Signed)
Immediate Anesthesia Transfer of Care Note  Patient: Julia Bell  Procedure(s) Performed: REPEAT CESAREAN SECTION (N/A )  Patient Location: PACU  Anesthesia Type:Spinal  Level of Consciousness: awake  Airway & Oxygen Therapy: Patient Spontanous Breathing  Post-op Assessment: Report given to RN and Post -op Vital signs reviewed and stable  Post vital signs: stable  Last Vitals:  Vitals:   10/15/17 2236 10/16/17 0036  BP: 123/81 123/75  Pulse: (!) 110 74  Resp: 18 16  Temp: 36.9 C 36.6 C    Last Pain:  Vitals:   10/15/17 2256  PainSc: 6          Complications: No apparent anesthesia complications

## 2017-10-16 NOTE — Progress Notes (Signed)
Subjective: Postpartum Day 0: Cesarean Delivery Patient reports incisional pain and tolerating PO.    Objective: Vital signs in last 24 hours: Temp:  [97.5 F (36.4 C)-98.4 F (36.9 C)] 98.4 F (36.9 C) (11/05 0659) Pulse Rate:  [52-110] 57 (11/05 0659) Resp:  [14-23] 20 (11/05 0659) BP: (100-123)/(45-81) 112/60 (11/05 0659) SpO2:  [95 %-98 %] 98 % (11/05 0659) Weight:  [161 lb (73 kg)] 161 lb (73 kg) (11/04 2236)  Physical Exam:  General: alert Lochia: appropriate Uterine Fundus: firm Incision: dressing D/D/I  Recent Labs    10/13/17 1025 10/16/17 0509  HGB 12.0 11.6*  HCT 34.2* 33.8*    Assessment/Plan: Status post Cesarean section. Doing well postoperatively.  Continue current care, ambulate.  Leighton Roachodd D Marino Rogerson 10/16/2017, 8:21 AM

## 2017-10-16 NOTE — Anesthesia Postprocedure Evaluation (Signed)
Anesthesia Post Note  Patient: Jenai K Lavallee  Procedure(s) Performed: REPEAT CESAREAN SECTION (N/A )     Patient location during evaluation: PACU Anesthesia Type: Spinal Level of consciousness: oriented and awake and alert Pain management: pain level controlled Vital Signs Assessment: post-procedure vital signs reviewed and stable Respiratory status: spontaneous breathing and respiratory function stable Cardiovascular status: blood pressure returned to baseline and stable Postop Assessment: no headache, no backache and no apparent nausea or vomiting Anesthetic complications: no    Last Vitals:  Vitals:   10/16/17 0330 10/16/17 0400  BP: 107/66 108/60  Pulse: 66 65  Resp: (!) 22 20  Temp: (!) 36.4 C 36.7 C  SpO2: 98% 95%    Last Pain:  Vitals:   10/16/17 0400  TempSrc: Oral  PainSc:    Pain Goal:                 Lowella CurbWarren Ray Shantese Raven

## 2017-10-16 NOTE — Addendum Note (Signed)
Addendum  created 10/16/17 0840 by Graciela HusbandsFussell, [place] Hester O, CRNA   Sign clinical note

## 2017-10-16 NOTE — Op Note (Signed)
Operative Note    Preoperative Diagnosis Term pregnancy at 39+weeks Prior LTCS x 2 Spontaneous rupture of membranes  Postoperative Diagnosis same  Procedure Repeat low transverse c-section with 2 layer closure (Kiwi vacuum used to assist in delivery of head)  Surgeon Huel CoteKathy Kirti Carl, MD  Anesthesia Spinal  Fluids: EBL  628mL UOP  100mL clear IVF  2200mL  Findings A viable female infant in the vertex presentation.  Apgars 8,9 Weight pending.  Normal anatomy with nml uterus tubes and ovaries.  Bladder flap was scarred up to mid fundal area  Specimen Placenta to L&D  Procedure Note Patient was taken to the operating room where spinal anesthesia was obtained and found to be adequate by Allis clamp test. She was prepped and draped in the normal sterile fashion in the dorsal supine position with a leftward tilt. An appropriate time out was performed. A Pfannenstiel skin incision was then made through a pre-existing scar with the scalpel and carried through to the underlying layer of fascia by sharp dissection and Bovie cautery. The fascia was nicked in the midline and the incision was extended laterally with Mayo scissors. The inferior aspect of the incision was grasped Coker clamps and dissected off the underlying rectus muscles. In a similar fashion the superior aspect was dissected off the rectus muscles. Rectus muscles were separated in the midline and the peritoneal cavity entered bluntly. The peritoneal incision was then extended both superiorly and inferiorly with careful attention to avoid both bowel and bladder.  The bladder flap was scarred up to about the mid-fundus and had to be taken down to access the LUS.   The Alexis self-retaining wound retractor was then placed within the incision and the lower uterine segment exposed.  The lower uterine segment was then incised in a transverse fashion and the cavity itself entered bluntly. The incision was extended bluntly. The infant's  head was then lifted and as could not quite be delivered with fundal pressure, the Kiwi vacuum applied and used to deliver the head with one pop-off.   The remainder of the infant delivered and the nose and mouth bulb suctioned with the cord clamped and cut as well. The infant was handed off to the waiting pediatricians. The placenta was then spontaneously expressed from the uterus and the uterus cleared of all clots and debris with moist lap sponge. The uterine incision was then repaired in 2 layers the first layer was a running locked layer of 1-0 chromic and the second an imbricating layer of the same suture. The tubes and ovaries were inspected and the gutters cleared of all clots and debris. The uterine incision was inspected and found to be hemostatic. All instruments and sponges as well as the Alexis retractor were then removed from the abdomen. The rectus muscles and peritoneum were then reapproximated with a running suture of 2-0 Vicryl. The fascia was then closed with 0 Vicryl in a running fashion. Subcutaneous tissue was reapproximated with 3-0 plain in a running fashion. The skin was closed with a subcuticular stitch of 4-0 Vicryl on a Keith needle and then reinforced with benzoin and Steri-Strips. At the conclusion of the procedure all instruments and sponge counts were correct. Patient was taken to the recovery room in good condition with her baby accompanying her skin to skin.

## 2017-10-16 NOTE — MAU Provider Note (Signed)
Addendum to H&P  Pt called with SROM at 2130pm, came in and confirmed in MAU. Mild contractions and cervix 1/80/-2 so waited until had been NPO x 8 hours and now ready to proceed.  No changes in exam except still has a deep cough.  Placed on augmentin for that and has taken 3 days worth.  No fever or other issues.

## 2017-10-17 LAB — BIRTH TISSUE RECOVERY COLLECTION (PLACENTA DONATION)

## 2017-10-17 NOTE — Plan of Care (Signed)
Pt has not had a bowel movement yet, but her bowel sound are active.

## 2017-10-17 NOTE — Progress Notes (Signed)
Subjective: Postpartum Day 1: Cesarean Delivery Patient reports tolerating PO and no problems voiding.  Having some right neck pain and shoulder pain.  Seems musculoskeletal in nature.    Objective: Vital signs in last 24 hours: Temp:  [97.5 F (36.4 C)-98.2 F (36.8 C)] 97.5 F (36.4 C) (11/06 0500) Pulse Rate:  [51-66] 56 (11/06 0500) Resp:  [18-20] 18 (11/06 0500) BP: (110-127)/(56-76) 111/76 (11/06 0500) SpO2:  [96 %-98 %] 96 % (11/05 1715)  Physical Exam:  General: alert and cooperative Lochia: appropriate Uterine Fundus: firm Incision: C/D/I   Recent Labs    10/16/17 0509  HGB 11.6*  HCT 33.8*    Assessment/Plan: Status post Cesarean section. Doing well postoperatively.  Continue current care.  Julia Bell 10/17/2017, 10:11 AM

## 2017-10-18 MED ORDER — IBUPROFEN 600 MG PO TABS: 600.0000 mg | ORAL_TABLET | Freq: Four times a day (QID) | ORAL | 1 refills | Status: DC | PRN

## 2017-10-18 MED ORDER — OXYCODONE HCL 5 MG PO TABS: 5.0000 mg | ORAL_TABLET | Freq: Four times a day (QID) | ORAL | 0 refills | Status: DC | PRN

## 2017-10-18 MED ORDER — PRENATAL MULTIVITAMIN CH: 1.0000 | ORAL_TABLET | Freq: Every day | ORAL | 3 refills | Status: DC

## 2017-10-18 NOTE — Discharge Summary (Signed)
OB Discharge Summary     Patient Name: Julia Bell DOB: 1986/08/07 MRN: [id number]  Date of admission: 10/15/2017 Delivering MD: Huel CoteICHARDSON, KATHY   Date of discharge: 10/18/2017  Admitting diagnosis: 39 WKS, WATER BROKE Intrauterine pregnancy: [id number]     Secondary diagnosis:  Active Problems:   S/P repeat low transverse C-section  Additional problems: SROM      Discharge diagnosis: Term Pregnancy Delivered                                                                                                Post partum procedures:N/A  Augmentation: N/A  Complications: None  Hospital course:   SROM, presented early for repeat LTCS, delivered without difficulty, see operative report.  Routine PP course.         Physical exam  Vitals:   10/17/17 0126 10/17/17 0500 10/17/17 1911 10/18/17 0536  BP: (!) 113/56 111/76 126/62 122/70  Pulse: (!) 51 (!) 56 65 76  Resp: 18 18 18 18   Temp: 98.1 F (36.7 C) (!) 97.5 F (36.4 C) 98.4 F (36.9 C) 98 F (36.7 C)  TempSrc: Oral Oral Oral Oral  SpO2:      Weight:      Height:       General: alert and no distress Lochia: appropriate Uterine Fundus: firm Incision: Healing well with no significant drainage DVT Evaluation: No evidence of DVT seen on physical exam. Labs: Lab Results  Component Value Date   WBC 12.1 (H) 10/16/2017   HGB 11.6 (L) 10/16/2017   HCT 33.8 (L) 10/16/2017   MCV 83.9 10/16/2017   PLT 188 10/16/2017   No flowsheet data found.  Discharge instruction: per After Visit Summary and "Baby and Me Booklet".  After visit meds:  Allergies as of 10/18/2017   No Known Allergies     Medication List    TAKE these medications   acetaminophen 500 MG tablet Commonly known as:  TYLENOL Take 500 mg by mouth every 6 (six) hours as needed (for headaches.).   amoxicillin-clavulanate 875-125 MG tablet Commonly known as:  AUGMENTIN Take 1 tablet 2 (two) times daily by mouth. 7 day regimen   ibuprofen 600 MG  tablet Commonly known as:  ADVIL,MOTRIN Take 1 tablet (600 mg total) every 6 (six) hours as needed by mouth.   oxyCODONE 5 MG immediate release tablet Commonly known as:  Oxy IR/ROXICODONE Take 1 tablet (5 mg total) every 6 (six) hours as needed by mouth for severe pain (pain scale 4-7).   prenatal multivitamin Tabs tablet Take 1 tablet daily by mouth.       Diet: routine diet  Activity: Advance as tolerated. Pelvic rest for 6 weeks.   Outpatient follow up:2 and 6 weeks Follow up Appt:No future appointments. Follow up Visit:No Follow-up on file.  Postpartum contraception: Undecided  Newborn Data: Live born female  Birth Weight: 7 lb 10.2 oz (3465 g) APGAR: ,   Newborn Delivery   Birth date/time:  10/16/2017 01:43:00 Delivery type:  C-Section, Vacuum Assisted C-section categorization:  Repeat     Baby Feeding: Breast Disposition:home with  mother   10/18/2017 Sherian ReinJody Bovard-Stuckert, MD

## 2017-10-18 NOTE — Lactation Note (Signed)
This note was copied from a baby's chart. Lactation Consultation Note  Patient Name: Julia Bell Julia Bell ZOXWR'UToday's Date: 10/18/2017 Reason for consult: Follow-up assessment  Follow up visit at 55 hours of age.  Mom denies concerns about breastfeeding.  Mom reports she has hand pump and has used a few times to give colostrum to baby.  Mom reports baby has been sleepy at breast and is starting to do better. Discussed milk transitioning to larger volume, engorgement care discussed.  Encouraged frequent feedings 8-12x/day. Mom to soften breast as needed prior to latch.  Mom aware of o/p services as needed. Mom plans to follow up with peds LC.     Maternal Data    Feeding Feeding Type: Breast Fed Length of feed: 20 min  LATCH Score                   Interventions Interventions: Breast feeding basics reviewed  Lactation Tools Discussed/Used     Consult Status Consult Status: Complete    Julia DellJana Bell 10/18/2017, 9:17 AM

## 2017-10-18 NOTE — Progress Notes (Addendum)
Subjective: Postpartum Day 2: Cesarean Delivery Patient reports incisional pain, tolerating PO and no problems voiding.  Pain controlled.    Objective: Vital signs in last 24 hours: Temp:  [98 F (36.7 C)-98.4 F (36.9 C)] 98 F (36.7 C) (11/07 0536) Pulse Rate:  [65-76] 76 (11/07 0536) Resp:  [18] 18 (11/07 0536) BP: (122-126)/(62-70) 122/70 (11/07 0536)  Physical Exam:  General: alert and no distress Lochia: appropriate Uterine Fundus: firm Incision: healing well DVT Evaluation: No evidence of DVT seen on physical exam.  Recent Labs    10/16/17 0509  HGB 11.6*  HCT 33.8*    Assessment/Plan: Status post Cesarean section. Doing well postoperatively.  Continue current care - Pt desires d/c to home - will d/c with Motrin, PNV and Percocet, has had cough - d/w pt Delsym use  Yareth Kearse Bovard-Stuckert 10/18/2017, 7:59 AM

## 2017-10-27 ENCOUNTER — Encounter (HOSPITAL_COMMUNITY): Payer: Self-pay | Admitting: *Deleted

## 2020-08-03 LAB — OB RESULTS CONSOLE GC/CHLAMYDIA
Chlamydia: NEGATIVE
Gonorrhea: NEGATIVE

## 2020-08-03 LAB — OB RESULTS CONSOLE HIV ANTIBODY (ROUTINE TESTING): HIV: NONREACTIVE

## 2020-08-03 LAB — OB RESULTS CONSOLE ANTIBODY SCREEN: Antibody Screen: NEGATIVE

## 2020-08-03 LAB — OB RESULTS CONSOLE ABO/RH: RH Type: POSITIVE

## 2020-08-03 LAB — OB RESULTS CONSOLE RUBELLA ANTIBODY, IGM: Rubella: IMMUNE

## 2020-08-03 LAB — OB RESULTS CONSOLE RPR: RPR: NONREACTIVE

## 2020-08-03 LAB — OB RESULTS CONSOLE HEPATITIS B SURFACE ANTIGEN: Hepatitis B Surface Ag: NEGATIVE

## 2021-02-10 ENCOUNTER — Encounter (HOSPITAL_COMMUNITY): Payer: Self-pay | Admitting: *Deleted

## 2021-02-10 NOTE — Patient Instructions (Addendum)
KATI RIGGENBACH  02/10/2021   Your procedure is scheduled on:  02/22/2021  Arrive at 0530 at [employer] [facility] on [address] at [facility]  and [employer]. You are invited to use the FREE valet parking or use the Visitor's parking deck.  Pick up the phone at the desk and dial [phone].  Call this number if you have problems the morning of surgery: [phone]  Remember:   Do not eat food:(After Midnight) Desps de medianoche.  Do not drink clear liquids: (After Midnight) Desps de medianoche.  Take these medicines the morning of surgery with A SIP OF WATER:  none   Do not wear jewelry, make-up or nail polish.  Do not wear lotions, powders, or perfumes. Do not wear deodorant.  Do not shave 48 hours prior to surgery.  Do not bring valuables to the hospital.  [facility] is not   responsible for any belongings or valuables brought to the hospital.  Contacts, dentures or bridgework may not be worn into surgery.  Leave suitcase in the car. After surgery it may be brought to your room.  For patients admitted to the hospital, checkout time is 11:00 AM the day of              discharge.      Please read over the following fact sheets that you were given:     Preparing for Surgery

## 2021-02-19 ENCOUNTER — Other Ambulatory Visit (HOSPITAL_COMMUNITY): Payer: Self-pay

## 2021-02-19 ENCOUNTER — Other Ambulatory Visit: Payer: Self-pay

## 2021-02-19 ENCOUNTER — Encounter (HOSPITAL_COMMUNITY)
Admission: RE | Admit: 2021-02-19 | Discharge: 2021-02-19 | Disposition: A | Discharge status: Home / Self Care | Payer: Self-pay | Source: Ambulatory Visit | Attending: Obstetrics and Gynecology | Admitting: Obstetrics and Gynecology

## 2021-02-19 DIAGNOSIS — Z01812 Encounter for preprocedural laboratory examination: Secondary | ICD-10-CM | POA: Insufficient documentation

## 2021-02-19 LAB — CBC
HCT: 37.9 % (ref 36.0–46.0)
Hemoglobin: 12.8 g/dL (ref 12.0–15.0)
MCH: 29 pg (ref 26.0–34.0)
MCHC: 33.8 g/dL (ref 30.0–36.0)
MCV: 85.9 fL (ref 80.0–100.0)
Platelets: 209 10*3/uL (ref 150–400)
RBC: 4.41 MIL/uL (ref 3.87–5.11)
RDW: 13.6 % (ref 11.5–15.5)
WBC: 8.5 10*3/uL (ref 4.0–10.5)
nRBC: 0 % (ref 0.0–0.2)

## 2021-02-19 LAB — TYPE AND SCREEN
ABO/RH(D): O POS
Antibody Screen: NEGATIVE

## 2021-02-20 LAB — RPR: RPR Ser Ql: NONREACTIVE

## 2021-02-21 NOTE — H&P (Signed)
Julia Bell is a 35 y.o. female [id number] at 71 2/7 weeks (EDD 02/27/21 by LMP c/w 9 week [place]) presenting for scheduled repeat c-section.  Prenatal care significant for: 1)History of cesarean section  Prior c-section x 3, for repeat at 39 weeks--scheduled 02/22/21 2) COVID-19--  + COVID 12/16/20  Mild course 3) Variacella non-immune   OB History    Gravida  5   Para  3   Term  3   Preterm      AB  1   Living  3     SAB  1   IAB      Ectopic      Multiple  0   Live Births  3         02-27-2013, 40 wks F, 8lbs 11oz, Cesarean Section  06-08-2015, 11 wks  SAB x 1  05-18-2016, 40 wks  F, 7lbs 6oz, Cesarean Section  10-16-2017, 39.2 wks M, 7lbs 10oz, Cesarean Section  Past Medical History:  Diagnosis Date  . Medical history non-contributory    Past Surgical History:  Procedure Laterality Date  . BREAST SURGERY     benign cyst drained  . CESAREAN SECTION N/A 02/27/2013   Procedure: CESAREAN SECTION;  Surgeon: Bing Plume, MD;  Location: [facility] ORS;  Service: Obstetrics;  Laterality: N/A;  Primary Cesarean Section Delivery Baby Girl @ 0101   Apgars 7/9  . CESAREAN SECTION N/A 05/18/2016   Procedure: CESAREAN SECTION;  Surgeon: Huel Cote, MD;  Location: [facility] BIRTHING SUITES;  Service: Obstetrics;  Laterality: N/A;  RNFA request [place] [place] or [place]  . CESAREAN SECTION N/A 10/16/2017   Procedure: REPEAT CESAREAN SECTION;  Surgeon: Huel Cote, MD;  Location: [facility] BIRTHING SUITES;  Service: Obstetrics;  Laterality: N/A;  Odelia Gage, RNFA  . NO PAST SURGERIES     Family History: family history includes Diabetes in her maternal grandfather; Heart attack in her maternal grandfather. Social History:  reports that she has never smoked. She has never used smokeless tobacco. She reports that she does not drink alcohol and does not use drugs.     Maternal Diabetes: No Genetic Screening: Declined Maternal Ultrasounds/Referrals: Normal Fetal Ultrasounds or other  Referrals:  None Maternal Substance Abuse:  No Significant Maternal Medications:  None Significant Maternal Lab Results:  Group B Strep negative Other Comments:  None  Review of Systems  Constitutional: Negative for fever.  Gastrointestinal: Negative for abdominal pain.  Genitourinary: Negative for pelvic pain.   Maternal Medical History:  Contractions: Frequency: irregular.   Perceived severity is mild.    Fetal activity: Perceived fetal activity is normal.    Prenatal complications: Prior c-section x 3  Prenatal Complications - Diabetes: none.      unknown if currently breastfeeding. Maternal Exam:  Abdomen: Patient reports no abdominal tenderness. Surgical scars: low transverse.   Fetal presentation: vertex  Introitus: Normal vulva. Normal vagina.    Physical Exam Constitutional:      Appearance: Normal appearance.  Cardiovascular:     Rate and Rhythm: Normal rate and regular rhythm.     Pulses: Normal pulses.  Pulmonary:     Effort: Pulmonary effort is normal.  Abdominal:     Palpations: Abdomen is soft.  Genitourinary:    General: Normal vulva.  Neurological:     Mental Status: She is alert.  Psychiatric:        Mood and Affect: Mood normal.     Prenatal labs: ABO, Rh: --/--/O POS (03/11 0623) Antibody: NEG (03/11  9833) Rubella: Immune (08/23 0000) RPR: NON REACTIVE (03/11 0914)  HBsAg: Negative (08/23 0000)  HIV: Non-reactive (08/23 0000)  GBS:   Negative One hour GCT 142, Three hour GTT WNL CF carrier screen negative 2013    Assessment/Plan: d/w pt repeat c-section risk/benefits in detail.  Discussed bleeding infection and possible damage to bowel and bladder, especially with this being 4th c-section.  Pt ready to proceed.She declines concurrent tubal sterilization.   Oliver Pila 02/21/2021, 5:33 PM

## 2021-02-22 ENCOUNTER — Inpatient Hospital Stay (HOSPITAL_COMMUNITY): Payer: Self-pay | Admitting: Certified Registered Nurse Anesthetist

## 2021-02-22 ENCOUNTER — Encounter (HOSPITAL_COMMUNITY)
Admission: RE | Disposition: A | Discharge status: Home / Self Care | Payer: Self-pay | Source: Home / Self Care | Attending: Obstetrics and Gynecology

## 2021-02-22 ENCOUNTER — Encounter (HOSPITAL_COMMUNITY): Payer: Self-pay | Admitting: Obstetrics and Gynecology

## 2021-02-22 ENCOUNTER — Inpatient Hospital Stay (HOSPITAL_COMMUNITY)
Admission: RE | Admit: 2021-02-22 | Discharge: 2021-02-24 | DRG: 788 | Disposition: A | Discharge status: Home / Self Care | Payer: Self-pay | Attending: Obstetrics and Gynecology | Admitting: Obstetrics and Gynecology

## 2021-02-22 ENCOUNTER — Other Ambulatory Visit: Payer: Self-pay

## 2021-02-22 DIAGNOSIS — Z98891 History of uterine scar from previous surgery: Secondary | ICD-10-CM

## 2021-02-22 DIAGNOSIS — Z8616 Personal history of COVID-19: Secondary | ICD-10-CM

## 2021-02-22 DIAGNOSIS — O34211 Maternal care for low transverse scar from previous cesarean delivery: Principal | ICD-10-CM | POA: Diagnosis present

## 2021-02-22 DIAGNOSIS — Z3A39 39 weeks gestation of pregnancy: Secondary | ICD-10-CM

## 2021-02-22 SURGERY — Surgical Case
Anesthesia: Spinal | Wound class: Clean Contaminated

## 2021-02-22 MED ORDER — SODIUM CHLORIDE 0.9% FLUSH: 3.0000 mL | INTRAVENOUS | Status: DC | PRN

## 2021-02-22 MED ORDER — DEXAMETHASONE SODIUM PHOSPHATE 4 MG/ML IJ SOLN
INTRAMUSCULAR | Status: DC | PRN
  Administered 2021-02-22: 5 mg via INTRAVENOUS

## 2021-02-22 MED ORDER — SCOPOLAMINE 1 MG/3DAYS TD PT72
MEDICATED_PATCH | TRANSDERMAL | Status: AC
  Filled 2021-02-22: qty 1

## 2021-02-22 MED ORDER — CEFAZOLIN SODIUM-DEXTROSE 2-4 GM/100ML-% IV SOLN: 2.0000 g | INTRAVENOUS | Status: DC

## 2021-02-22 MED ORDER — NALOXONE HCL 4 MG/10ML IJ SOLN
1.0000 ug/kg/h | INTRAVENOUS | Status: DC | PRN
  Filled 2021-02-22: qty 5

## 2021-02-22 MED ORDER — SOD CITRATE-CITRIC ACID 500-334 MG/5ML PO SOLN: 30.0000 mL | Freq: Once | ORAL | Status: DC

## 2021-02-22 MED ORDER — DEXAMETHASONE SODIUM PHOSPHATE 4 MG/ML IJ SOLN
INTRAMUSCULAR | Status: AC
  Filled 2021-02-22: qty 1

## 2021-02-22 MED ORDER — DIPHENHYDRAMINE HCL 25 MG PO CAPS: 25.0000 mg | ORAL_CAPSULE | ORAL | Status: DC | PRN

## 2021-02-22 MED ORDER — ACETAMINOPHEN 500 MG PO TABS
1000.0000 mg | ORAL_TABLET | Freq: Four times a day (QID) | ORAL | Status: DC
  Administered 2021-02-22 – 2021-02-24 (×8): 1000 mg via ORAL
  Filled 2021-02-22 (×8): qty 2

## 2021-02-22 MED ORDER — ONDANSETRON HCL 4 MG/2ML IJ SOLN: 4.0000 mg | Freq: Three times a day (TID) | INTRAMUSCULAR | Status: DC | PRN

## 2021-02-22 MED ORDER — OXYCODONE HCL 5 MG PO TABS
5.0000 mg | ORAL_TABLET | ORAL | Status: DC | PRN
  Administered 2021-02-23: 10 mg via ORAL
  Administered 2021-02-24: 5 mg via ORAL
  Filled 2021-02-22 (×2): qty 2

## 2021-02-22 MED ORDER — SIMETHICONE 80 MG PO CHEW
80.0000 mg | CHEWABLE_TABLET | Freq: Three times a day (TID) | ORAL | Status: DC
  Administered 2021-02-22 – 2021-02-24 (×7): 80 mg via ORAL
  Filled 2021-02-22 (×7): qty 1

## 2021-02-22 MED ORDER — SIMETHICONE 80 MG PO CHEW: 80.0000 mg | CHEWABLE_TABLET | ORAL | Status: DC | PRN

## 2021-02-22 MED ORDER — MEPERIDINE HCL 25 MG/ML IJ SOLN: 6.2500 mg | INTRAMUSCULAR | Status: DC | PRN

## 2021-02-22 MED ORDER — IBUPROFEN 800 MG PO TABS
800.0000 mg | ORAL_TABLET | Freq: Three times a day (TID) | ORAL | Status: DC
  Administered 2021-02-22 – 2021-02-24 (×7): 800 mg via ORAL
  Filled 2021-02-22 (×7): qty 1

## 2021-02-22 MED ORDER — WITCH HAZEL-GLYCERIN EX PADS
1.0000 | MEDICATED_PAD | CUTANEOUS | Status: DC | PRN
Start: 2021-02-22 — End: 2021-02-24

## 2021-02-22 MED ORDER — DIPHENHYDRAMINE HCL 25 MG PO CAPS: 25.0000 mg | ORAL_CAPSULE | Freq: Four times a day (QID) | ORAL | Status: DC | PRN

## 2021-02-22 MED ORDER — CEFAZOLIN SODIUM-DEXTROSE 2-3 GM-%(50ML) IV SOLR
INTRAVENOUS | Status: DC | PRN
  Administered 2021-02-22: 2 g via INTRAVENOUS

## 2021-02-22 MED ORDER — PHENYLEPHRINE HCL-NACL 20-0.9 MG/250ML-% IV SOLN
INTRAVENOUS | Status: AC
  Filled 2021-02-22: qty 250

## 2021-02-22 MED ORDER — TETANUS-DIPHTH-ACELL PERTUSSIS 5-2.5-18.5 LF-MCG/0.5 IM SUSY: 0.5000 mL | PREFILLED_SYRINGE | Freq: Once | INTRAMUSCULAR | Status: DC

## 2021-02-22 MED ORDER — MORPHINE SULFATE (PF) 0.5 MG/ML IJ SOLN
INTRAMUSCULAR | Status: DC | PRN
  Administered 2021-02-22: 150 ug via INTRATHECAL

## 2021-02-22 MED ORDER — LACTATED RINGERS IV SOLN: INTRAVENOUS | Status: DC

## 2021-02-22 MED ORDER — PROMETHAZINE HCL 25 MG/ML IJ SOLN
6.2500 mg | INTRAMUSCULAR | Status: DC | PRN
Start: 2021-02-22 — End: 2021-02-22

## 2021-02-22 MED ORDER — AMISULPRIDE (ANTIEMETIC) 5 MG/2ML IV SOLN: 10.0000 mg | Freq: Once | INTRAVENOUS | Status: DC | PRN

## 2021-02-22 MED ORDER — NALBUPHINE HCL 10 MG/ML IJ SOLN
5.0000 mg | Freq: Once | INTRAMUSCULAR | Status: DC | PRN
Start: 2021-02-22 — End: 2021-02-24

## 2021-02-22 MED ORDER — ACETAMINOPHEN 10 MG/ML IV SOLN
INTRAVENOUS | Status: AC
  Filled 2021-02-22: qty 100

## 2021-02-22 MED ORDER — FENTANYL CITRATE (PF) 100 MCG/2ML IJ SOLN
INTRAMUSCULAR | Status: DC | PRN
  Administered 2021-02-22: 15 ug via INTRATHECAL

## 2021-02-22 MED ORDER — ZOLPIDEM TARTRATE 5 MG PO TABS: 5.0000 mg | ORAL_TABLET | Freq: Every evening | ORAL | Status: DC | PRN

## 2021-02-22 MED ORDER — SOD CITRATE-CITRIC ACID 500-334 MG/5ML PO SOLN
ORAL | Status: AC
  Filled 2021-02-22: qty 30

## 2021-02-22 MED ORDER — ACETAMINOPHEN 10 MG/ML IV SOLN
1000.0000 mg | Freq: Once | INTRAVENOUS | Status: DC | PRN
  Administered 2021-02-22: 1000 mg via INTRAVENOUS

## 2021-02-22 MED ORDER — NALBUPHINE HCL 10 MG/ML IJ SOLN: 5.0000 mg | Freq: Once | INTRAMUSCULAR | Status: DC | PRN

## 2021-02-22 MED ORDER — SCOPOLAMINE 1 MG/3DAYS TD PT72: 1.0000 | MEDICATED_PATCH | Freq: Once | TRANSDERMAL | Status: DC

## 2021-02-22 MED ORDER — ONDANSETRON HCL 4 MG/2ML IJ SOLN
INTRAMUSCULAR | Status: DC | PRN
  Administered 2021-02-22: 4 mg via INTRAVENOUS

## 2021-02-22 MED ORDER — PRENATAL MULTIVITAMIN CH
1.0000 | ORAL_TABLET | Freq: Every day | ORAL | Status: DC
  Administered 2021-02-22 – 2021-02-24 (×3): 1 via ORAL
  Filled 2021-02-22 (×3): qty 1

## 2021-02-22 MED ORDER — OXYCODONE HCL 5 MG PO TABS: 5.0000 mg | ORAL_TABLET | Freq: Once | ORAL | Status: DC | PRN

## 2021-02-22 MED ORDER — PHENYLEPHRINE HCL-NACL 20-0.9 MG/250ML-% IV SOLN
INTRAVENOUS | Status: DC | PRN
  Administered 2021-02-22: 60 ug/min via INTRAVENOUS

## 2021-02-22 MED ORDER — FENTANYL CITRATE (PF) 100 MCG/2ML IJ SOLN: 25.0000 ug | INTRAMUSCULAR | Status: DC | PRN

## 2021-02-22 MED ORDER — MORPHINE SULFATE (PF) 0.5 MG/ML IJ SOLN
INTRAMUSCULAR | Status: AC
  Filled 2021-02-22: qty 10

## 2021-02-22 MED ORDER — MENTHOL 3 MG MT LOZG: 1.0000 | LOZENGE | OROMUCOSAL | Status: DC | PRN

## 2021-02-22 MED ORDER — OXYTOCIN-SODIUM CHLORIDE 30-0.9 UT/500ML-% IV SOLN
INTRAVENOUS | Status: DC | PRN
  Administered 2021-02-22: 30 [IU] via INTRAVENOUS

## 2021-02-22 MED ORDER — BUPIVACAINE IN DEXTROSE 0.75-8.25 % IT SOLN
INTRATHECAL | Status: DC | PRN
  Administered 2021-02-22: 1.6 mL via INTRATHECAL

## 2021-02-22 MED ORDER — KETOROLAC TROMETHAMINE 30 MG/ML IJ SOLN
INTRAMUSCULAR | Status: AC
  Filled 2021-02-22: qty 1

## 2021-02-22 MED ORDER — SODIUM CHLORIDE 0.9 % IR SOLN
Status: DC | PRN
  Administered 2021-02-22: 1000 mL

## 2021-02-22 MED ORDER — SENNOSIDES-DOCUSATE SODIUM 8.6-50 MG PO TABS
2.0000 | ORAL_TABLET | Freq: Every day | ORAL | Status: DC
  Administered 2021-02-23 – 2021-02-24 (×2): 2 via ORAL
  Filled 2021-02-22 (×2): qty 2

## 2021-02-22 MED ORDER — ONDANSETRON HCL 4 MG/2ML IJ SOLN
INTRAMUSCULAR | Status: AC
  Filled 2021-02-22: qty 2

## 2021-02-22 MED ORDER — SOD CITRATE-CITRIC ACID 500-334 MG/5ML PO SOLN
30.0000 mL | Freq: Once | ORAL | Status: AC
  Administered 2021-02-22: 30 mL via ORAL

## 2021-02-22 MED ORDER — POVIDONE-IODINE 10 % EX SWAB: 2.0000 "application " | Freq: Once | CUTANEOUS | Status: DC

## 2021-02-22 MED ORDER — DIBUCAINE (PERIANAL) 1 % EX OINT: 1.0000 "application " | TOPICAL_OINTMENT | CUTANEOUS | Status: DC | PRN

## 2021-02-22 MED ORDER — OXYCODONE HCL 5 MG/5ML PO SOLN: 5.0000 mg | Freq: Once | ORAL | Status: DC | PRN

## 2021-02-22 MED ORDER — DIPHENHYDRAMINE HCL 50 MG/ML IJ SOLN
12.5000 mg | INTRAMUSCULAR | Status: DC | PRN
Start: 2021-02-22 — End: 2021-02-22

## 2021-02-22 MED ORDER — NALBUPHINE HCL 10 MG/ML IJ SOLN
5.0000 mg | INTRAMUSCULAR | Status: DC | PRN
Start: 2021-02-22 — End: 2021-02-24

## 2021-02-22 MED ORDER — FENTANYL CITRATE (PF) 100 MCG/2ML IJ SOLN
INTRAMUSCULAR | Status: AC
  Filled 2021-02-22: qty 2

## 2021-02-22 MED ORDER — NALOXONE HCL 0.4 MG/ML IJ SOLN: 0.4000 mg | INTRAMUSCULAR | Status: DC | PRN

## 2021-02-22 MED ORDER — ACETAMINOPHEN 500 MG PO TABS: 1000.0000 mg | ORAL_TABLET | Freq: Four times a day (QID) | ORAL | Status: DC

## 2021-02-22 MED ORDER — KETOROLAC TROMETHAMINE 30 MG/ML IJ SOLN
30.0000 mg | Freq: Four times a day (QID) | INTRAMUSCULAR | Status: DC | PRN
  Administered 2021-02-22: 30 mg via INTRAVENOUS

## 2021-02-22 MED ORDER — CEFAZOLIN SODIUM-DEXTROSE 2-4 GM/100ML-% IV SOLN
INTRAVENOUS | Status: AC
  Filled 2021-02-22: qty 100

## 2021-02-22 MED ORDER — COCONUT OIL OIL: 1.0000 "application " | TOPICAL_OIL | Status: DC | PRN

## 2021-02-22 MED ORDER — LACTATED RINGERS IV SOLN: INTRAVENOUS | Status: DC | PRN

## 2021-02-22 MED ORDER — NALBUPHINE HCL 10 MG/ML IJ SOLN: 5.0000 mg | INTRAMUSCULAR | Status: DC | PRN

## 2021-02-22 MED ORDER — KETOROLAC TROMETHAMINE 30 MG/ML IJ SOLN: 30.0000 mg | Freq: Four times a day (QID) | INTRAMUSCULAR | Status: DC | PRN

## 2021-02-22 MED ORDER — SCOPOLAMINE 1 MG/3DAYS TD PT72
1.0000 | MEDICATED_PATCH | TRANSDERMAL | Status: DC
  Administered 2021-02-22: 1.5 mg via TRANSDERMAL

## 2021-02-22 MED ORDER — STERILE WATER FOR IRRIGATION IR SOLN
Status: DC | PRN
  Administered 2021-02-22: 1000 mL

## 2021-02-22 MED ORDER — OXYTOCIN-SODIUM CHLORIDE 30-0.9 UT/500ML-% IV SOLN: 2.5000 [IU]/h | INTRAVENOUS | Status: AC

## 2021-02-22 SURGICAL SUPPLY — 34 items
BENZOIN TINCTURE PRP APPL 2/3 (GAUZE/BANDAGES/DRESSINGS) ×2 IMPLANT
CHLORAPREP W/TINT 26ML (MISCELLANEOUS) ×2 IMPLANT
CLAMP CORD UMBIL (MISCELLANEOUS) IMPLANT
CLOSURE STERI STRIP 1/2 X4 (GAUZE/BANDAGES/DRESSINGS) ×2 IMPLANT
CLOTH BEACON ORANGE TIMEOUT ST (SAFETY) ×2 IMPLANT
DRSG OPSITE POSTOP 4X10 (GAUZE/BANDAGES/DRESSINGS) ×2 IMPLANT
ELECT REM PT RETURN 9FT ADLT (ELECTROSURGICAL) ×2
ELECTRODE REM PT RTRN 9FT ADLT (ELECTROSURGICAL) ×1 IMPLANT
EXTRACTOR VACUUM KIWI (MISCELLANEOUS) IMPLANT
GLOVE BIO SURGEON STRL SZ 6.5 (GLOVE) ×2 IMPLANT
GLOVE BIOGEL PI IND STRL 7.0 (GLOVE) ×1 IMPLANT
GLOVE BIOGEL PI INDICATOR 7.0 (GLOVE) ×1
GOWN STRL REUS W/TWL LRG LVL3 (GOWN DISPOSABLE) ×4 IMPLANT
KIT ABG SYR 3ML LUER SLIP (SYRINGE) IMPLANT
NEEDLE HYPO 25X5/8 SAFETYGLIDE (NEEDLE) IMPLANT
NS IRRIG 1000ML POUR BTL (IV SOLUTION) ×2 IMPLANT
PACK C SECTION WH (CUSTOM PROCEDURE TRAY) ×2 IMPLANT
PAD OB MATERNITY 4.3X12.25 (PERSONAL CARE ITEMS) ×2 IMPLANT
PENCIL SMOKE EVAC W/HOLSTER (ELECTROSURGICAL) ×2 IMPLANT
RTRCTR C-SECT PINK 25CM LRG (MISCELLANEOUS) ×2 IMPLANT
STRIP CLOSURE SKIN 1/2X4 (GAUZE/BANDAGES/DRESSINGS) IMPLANT
SUT CHROMIC 1 CTX 36 (SUTURE) ×4 IMPLANT
SUT PLAIN 0 NONE (SUTURE) IMPLANT
SUT PLAIN 2 0 XLH (SUTURE) ×2 IMPLANT
SUT VIC AB 0 CT1 27 (SUTURE) ×2
SUT VIC AB 0 CT1 27XBRD ANBCTR (SUTURE) ×2 IMPLANT
SUT VIC AB 2-0 CT1 27 (SUTURE) ×2
SUT VIC AB 2-0 CT1 TAPERPNT 27 (SUTURE) ×2 IMPLANT
SUT VIC AB 3-0 CT1 27 (SUTURE)
SUT VIC AB 3-0 CT1 TAPERPNT 27 (SUTURE) IMPLANT
SUT VIC AB 4-0 KS 27 (SUTURE) ×2 IMPLANT
TOWEL OR 17X24 6PK STRL BLUE (TOWEL DISPOSABLE) ×2 IMPLANT
TRAY FOLEY W/BAG SLVR 14FR LF (SET/KITS/TRAYS/PACK) ×2 IMPLANT
WATER STERILE IRR 1000ML POUR (IV SOLUTION) ×2 IMPLANT

## 2021-02-22 NOTE — Op Note (Signed)
Operative Note    Preoperative Diagnosis Term pregnancy at 39+ weeks Prior c-section x 3  Postoperative Diagnosis Same  Procedure Repeat low transverse c-section with two layer closure of uterus  Surgeon Julia Cote, MD  Anesthesia Spinal  Fluids: EBL UOP  clear IVF  LR  Findings Viable female infant in the vertex presentation.  Apgars 9,9.  Weight pending.  Normal uterus. Tubes and ovaries.  Bladder flap scarred about 1/2 way up fundus and a few omental adhesions to anterior abdominal wall taken down.   Specimen Placenta to L&D  Procedure Note Patient was taken to the operating room where spinal anesthesia was obtained and found to be adequate by Allis clamp test. She was prepped and draped in the normal sterile fashion in the dorsal supine position with a leftward tilt. An appropriate time out was performed. A Pfannenstiel skin incision was then made through a pre-existing scar with the scalpel and carried through to the underlying layer of fascia by sharp dissection and Bovie cautery. The fascia was nicked in the midline and the incision was extended laterally with Mayo scissors. The inferior aspect of the incision was grasped Coker clamps and dissected off the underlying rectus muscles. In a similar fashion the superior aspect was dissected off the rectus muscles. Rectus muscles were separated in the midline and the peritoneal cavity entered sharply as high as possible. The bladder was quite adherent to about halfway up the fundus and had to be released to expose the lower uterine segment and open the peritoneum. The peritoneal incision was then extended both superiorly and inferiorly with careful attention to avoid both bowel and bladder. The Alexis self-retaining wound retractor was then placed within the incision and the lower uterine segment exposed. The lower uterine segment was then incised in a transverse fashion and the cavity itself entered bluntly.  The incision was extended bluntly. The infant's head was then lifted and delivered from the incision without difficulty with fundal pressure. The remainder of the infant delivered and the nose and mouth bulb suctioned with the cord clamped and cut as well. The infant was handed off to the waiting pediatricians. The placenta was then spontaneously expressed from the uterus and the uterus cleared of all clots and debris with moist lap sponge. The uterine incision was then repaired in 2 layers the first layer was a running locked layer of 1-0 chromic and the second an imbricating layer of the same suture. The tubes and ovaries were inspected and the gutters cleared of all clots and debris. The uterine incision was inspected and found to be hemostatic. All instruments and sponges as well as the Alexis retractor were then removed from the abdomen. The rectus muscles and peritoneum were then reapproximated with a running suture of 2-0 Vicryl. The fascia was then closed with 0 Vicryl in a running fashion. Subcutaneous tissue was reapproximated with 0-chromic in a running fashion. The skin was closed with a subcuticular stitch of 4-0 Vicryl on a Keith needle and then reinforced with benzoin and Steri-Strips. At the conclusion of the procedure all instruments and sponge counts were correct. Patient was taken to the recovery room in good condition with her baby accompanying her skin to skin.

## 2021-02-22 NOTE — Anesthesia Procedure Notes (Signed)
Spinal  Patient location during procedure: OR Start time: 02/22/2021 7:20 AM End time: 02/22/2021 7:25 AM Staffing Performed: other anesthesia staff and anesthesiologist  Anesthesiologist: Mellody Dance, MD Preanesthetic Checklist Completed: patient identified, IV checked, risks and benefits discussed, surgical consent, monitors and equipment checked, pre-op evaluation and timeout performed Spinal Block Patient position: sitting Prep: DuraPrep and site prepped and draped Patient monitoring: continuous pulse ox, blood pressure and heart rate Approach: midline Location: L3-4 Injection technique: single-shot Needle Needle type: Pencan  Needle gauge: 24 G Needle length: 9 cm Additional Notes Functioning IV was confirmed and monitors were applied. Sterile prep and drape, including hand hygiene and sterile gloves were used. The patient was positioned and the spine was prepped. The skin was anesthetized with lidocaine.  Free flow of clear CSF was obtained prior to injecting local anesthetic into the CSF. The needle was carefully withdrawn. The patient tolerated the procedure well.

## 2021-02-22 NOTE — Transfer of Care (Signed)
Immediate Anesthesia Transfer of Care Note  Patient: Julia Bell  Procedure(s) Performed: REPEAT CESAREAN SECTION (N/A )  Patient Location: PACU  Anesthesia Type:Spinal  Level of Consciousness: awake  Airway & Oxygen Therapy: Patient Spontanous Breathing  Post-op Assessment: Report given to RN and Post -op Vital signs reviewed and stable  Post vital signs: Reviewed and stable  Last Vitals:  Vitals Value Taken Time  BP    Temp    Pulse    Resp    SpO2      Last Pain:  Vitals:   02/22/21 0613  TempSrc:   PainSc: 0-No pain         Complications: No complications documented.

## 2021-02-22 NOTE — Anesthesia Postprocedure Evaluation (Signed)
Anesthesia Post Note  Patient: Julia Bell  Procedure(s) Performed: REPEAT CESAREAN SECTION (N/A )     Patient location during evaluation: Mother Baby Anesthesia Type: Spinal Level of consciousness: oriented and awake and alert Pain management: pain level controlled Vital Signs Assessment: post-procedure vital signs reviewed and stable Respiratory status: spontaneous breathing and respiratory function stable Cardiovascular status: blood pressure returned to baseline and stable Postop Assessment: no headache, no backache, no apparent nausea or vomiting and patient able to bend at knees Anesthetic complications: no   No complications documented.  Last Vitals:  Vitals:   02/22/21 0930 02/22/21 0945  BP: 95/74 (!) 97/47  Pulse: (!) 55 (!) 59  Resp: 18 (!) 22  Temp:  36.5 C  SpO2: 99% 99%    Last Pain:  Vitals:   02/22/21 0945  TempSrc:   PainSc: 0-No pain   Pain Goal:    LLE Motor Response: Purposeful movement (02/22/21 0945)   RLE Motor Response: Purposeful movement (02/22/21 0945)       Epidural/Spinal Function Cutaneous sensation: Able to Discern Pressure (02/22/21 0945), Patient able to flex knees: Yes (02/22/21 0945), Patient able to lift hips off bed: No (02/22/21 0945), Back pain beyond tenderness at insertion site: No (02/22/21 0945), Progressively worsening motor and/or sensory loss: No (02/22/21 0945), Bowel and/or bladder incontinence post epidural: No (02/22/21 0945)  Mellody Dance

## 2021-02-22 NOTE — Interval H&P Note (Signed)
History and Physical Interval Note:  02/22/2021 7:16 AM  Julia Bell  has presented today for surgery, with the diagnosis of repeat c-section.  The various methods of treatment have been discussed with the patient and family. After consideration of risks, benefits and other options for treatment, the patient has consented to  Procedure(s) with comments: REPEAT CESAREAN SECTION (N/A) - Request RNFA if one by then as a surgical intervention.  The patient's history has been reviewed, patient examined, no change in status, stable for surgery.  I have reviewed the patient's chart and labs.  Questions were answered to the patient's satisfaction.     Oliver Pila

## 2021-02-22 NOTE — Anesthesia Preprocedure Evaluation (Addendum)
Anesthesia Evaluation  Patient identified by MRN, date of birth, ID band Patient awake    Reviewed: Allergy & Precautions, H&P , NPO status , Patient's Chart, lab work & pertinent test results  Airway Mallampati: I  TM Distance: >3 FB Neck ROM: full    Dental no notable dental hx.    Pulmonary neg pulmonary ROS,    Pulmonary exam normal        Cardiovascular negative cardio ROS Normal cardiovascular exam     Neuro/Psych negative neurological ROS  negative psych ROS   GI/Hepatic negative GI ROS, Neg liver ROS,   Endo/Other  negative endocrine ROS  Renal/GU negative Renal ROS     Musculoskeletal   Abdominal Normal abdominal exam  (+)   Peds  Hematology negative hematology ROS (+)   Anesthesia Other Findings   Reproductive/Obstetrics (+) Pregnancy                             Anesthesia Physical  Anesthesia Plan  ASA: II  Anesthesia Plan: Spinal   Post-op Pain Management:    Induction:   PONV Risk Score and Plan: 2 and Treatment may vary due to age or medical condition  Airway Management Planned: Natural Airway  Additional Equipment:   Intra-op Plan:   Post-operative Plan:   Informed Consent: I have reviewed the patients History and Physical, chart, labs and discussed the procedure including the risks, benefits and alternatives for the proposed anesthesia with the patient or authorized representative who has indicated his/her understanding and acceptance.       Plan Discussed with: CRNA, Surgeon and Anesthesiologist  Anesthesia Plan Comments:         Anesthesia Quick Evaluation

## 2021-02-23 LAB — CBC
HCT: 29.1 % — ABNORMAL LOW (ref 36.0–46.0)
Hemoglobin: 10.1 g/dL — ABNORMAL LOW (ref 12.0–15.0)
MCH: 30.2 pg (ref 26.0–34.0)
MCHC: 34.7 g/dL (ref 30.0–36.0)
MCV: 87.1 fL (ref 80.0–100.0)
Platelets: 160 10*3/uL (ref 150–400)
RBC: 3.34 MIL/uL — ABNORMAL LOW (ref 3.87–5.11)
RDW: 13.6 % (ref 11.5–15.5)
WBC: 12.2 10*3/uL — ABNORMAL HIGH (ref 4.0–10.5)
nRBC: 0 % (ref 0.0–0.2)

## 2021-02-23 LAB — BIRTH TISSUE RECOVERY COLLECTION (PLACENTA DONATION)

## 2021-02-23 NOTE — Progress Notes (Addendum)
CSW received consult due to score 12  on Edinburgh Depression Screen.    CSW met with the MOB at bedside, introduced role and reason for the visit to discuss [place] Depression screen and offer resources. MOB receptive to the visit. FOB present during the visit. CSW asked MOB if she wanted FOB to stay or leave for privacy. MOB agreeable for FOB to stay. MOB reports no hx of depression, anxiety or additional  mental health diagnosis. MOB reports she took the screening during the night while somewhat sleep. MOB reports no stressors. MOB denies SI/HI. MOB presented with a smile, while holding and feeding the baby.  CSW provided education regarding Baby Blues vs PMADs. CSW encouraged MOB to evaluate her mental health throughout the postpartum period with the use of the New Mom Checklist developed by [facility] and notify a medical professional if symptoms arise. MOB report understanding. CSW educated MOB about sudden infant death syndrome (SIDS). MOB knowledgeable.  MOB reports the baby has an established Pediatrician. MOB reports she has all items for the baby including a car seat and a bassinet. CSW assessed for additional needs.   No further need for interventions identified. No barriers to discharge.   Nicole Sinclair LCSW, MSW Clinical Social Worker  02/23/2021  1:03 PM 

## 2021-02-23 NOTE — Progress Notes (Signed)
Subjective: Postpartum Day 1 Cesarean Delivery Patient reports tolerating PO, + flatus and no problems voiding.    Objective: Vital signs in last 24 hours: Temp:  [97.4 F (36.3 C)-98.6 F (37 C)] 98.1 F (36.7 C) (03/15 0415) Pulse Rate:  [50-57] 50 (03/15 0415) Resp:  [16-18] 18 (03/15 0415) BP: (88-115)/(53-82) 105/82 (03/15 0415) SpO2:  [97 %-99 %] 99 % (03/15 0415)  Physical Exam:  General: alert and cooperative Lochia: appropriate Uterine Fundus: firm Incision: C/D/I   Recent Labs    02/23/21 0505  HGB 10.1*  HCT 29.1*    Assessment/Plan: Status post Cesarean section. Doing well postoperatively.  Continue current care. Pt desires circumcision.  Called and scheduled with nursery then peds called and wants [place] to hold off until tomorrow given weight loss of >7%.  Oliver Pila 02/23/2021, 9:45 AM

## 2021-02-24 MED ORDER — OXYCODONE HCL 5 MG PO TABS: 5.0000 mg | ORAL_TABLET | ORAL | 0 refills | Status: AC | PRN

## 2021-02-24 MED ORDER — IBUPROFEN 800 MG PO TABS
800.0000 mg | ORAL_TABLET | Freq: Three times a day (TID) | ORAL | 0 refills | Status: AC
Start: 2021-02-24 — End: ?

## 2021-02-24 NOTE — Progress Notes (Signed)
Subjective: Postpartum Day 2 Cesarean Delivery Patient reports tolerating PO, + flatus and no problems voiding.  Reviewed infants circ time at 1:30PM  Objective: Vital signs in last 24 hours: Temp:  [98 F (36.7 C)-98.6 F (37 C)] 98 F (36.7 C) (03/16 0600) Pulse Rate:  [77-85] 80 (03/16 0600) Resp:  [18] 18 (03/16 0600) BP: (116-125)/(59-70) 125/70 (03/16 0600) SpO2:  [97 %-98 %] 98 % (03/16 0600)  Physical Exam:  General: alert and cooperative Lochia: appropriate Uterine Fundus: firm Incision: C/D/I   Recent Labs    02/23/21 0505  HGB 10.1*  HCT 29.1*    Assessment/Plan: Status post Cesarean section. Doing well postoperatively.  Continue current care. Circumcision today. May discharge home if baby able to go Carlisle Cater 02/24/2021, 8:44 AM

## 2021-02-24 NOTE — Discharge Summary (Signed)
Postpartum Discharge Summary       Patient Name: Julia Bell DOB: [birth year] MRN: [id number]  Date of admission: 02/22/2021 Delivery date:02/22/2021  Delivering provider: Huel Cote  Date of discharge: 02/24/2021  Admitting diagnosis: S/P repeat low transverse C-section [Z98.891] Intrauterine pregnancy: [redacted]w[redacted]d     Secondary diagnosis:  Active Problems:   * No active hospital problems. *  Additional problems: none    Discharge diagnosis: Term Pregnancy Delivered                                              Post partum procedures:none Augmentation: N/A Complications: None  Hospital course: Sceduled C/S   35 y.o. yo [id number] at [redacted]w[redacted]d was admitted to the hospital 02/22/2021 for scheduled cesarean section with the following indication:Elective Repeat.Delivery details are as follows:  Membrane Rupture Time/Date: 7:57 AM ,02/22/2021   Delivery Method:C-Section, Low Transverse  Details of operation can be found in separate operative note.  Patient had an uncomplicated postpartum course.  She is ambulating, tolerating a regular diet, passing flatus, and urinating well. Patient is discharged home in stable condition on  02/24/21        Newborn Data: Birth date:02/22/2021  Birth time:7:58 AM  Gender:Female  Living status:Living  Apgars:9 ,9  Weight:3730 g     Magnesium Sulfate received: No BMZ received: No Rhophylac:No   Physical exam  Vitals:   02/23/21 0415 02/23/21 1353 02/23/21 2119 02/24/21 0600  BP: 105/82 (!) 116/59 120/60 125/70  Pulse: (!) 50 77 85 80  Resp: 18 18 18 18   Temp: 98.1 F (36.7 C) 98.6 F (37 C)  98 F (36.7 C)  TempSrc: Axillary Oral Oral Oral  SpO2: 99% 97% 98% 98%  Weight:      Height:       General: alert and cooperative Lochia: appropriate Uterine Fundus: firm Incision: Dressing is clean, dry, and intact DVT Evaluation: No evidence of DVT seen on physical exam. Labs: Lab Results  Component Value Date   WBC 12.2 (H) 02/23/2021   HGB  10.1 (L) 02/23/2021   HCT 29.1 (L) 02/23/2021   MCV 87.1 02/23/2021   PLT 160 02/23/2021   No flowsheet data found. Edinburgh Score: Edinburgh Postnatal Depression Scale Screening Tool 02/22/2021  I have been able to laugh and see the funny side of things. 2  I have looked forward with enjoyment to things. 0  I have blamed myself unnecessarily when things went wrong. 2  I have been anxious or worried for no good reason. 2  I have felt scared or panicky for no good reason. 1  Things have been getting on top of me. 2  I have been so unhappy that I have had difficulty sleeping. 1  I have felt sad or miserable. 1  I have been so unhappy that I have been crying. 1  The thought of harming myself has occurred to me. 0  Edinburgh Postnatal Depression Scale Total 12     After visit meds:  Allergies as of 02/24/2021   No Known Allergies     Medication List    TAKE these medications   ibuprofen 800 MG tablet Commonly known as: ADVIL Take 1 tablet (800 mg total) by mouth every 8 (eight) hours.   oxyCODONE 5 MG immediate release tablet Commonly known as: Oxy IR/ROXICODONE Take 1-2 tablets (5-10 mg total)  by mouth every 4 (four) hours as needed for moderate pain.   prenatal multivitamin Tabs tablet Take 1 tablet by mouth daily at 12 noon.            Discharge Care Instructions  (From admission, onward)         Start     Ordered   02/24/21 0000  Leave dressing on - Keep it clean, dry, and intact until clinic visit        02/24/21 1355           Discharge home in stable condition Infant Feeding: Breast Infant Disposition:home with mother Discharge instruction: per After Visit Summary and Postpartum booklet. Activity: Advance as tolerated. Pelvic rest for 6 weeks.  Diet: routine diet Future Appointments:No future appointments. Follow up Visit:  Follow-up Information    Huel Cote, MD. Schedule an appointment as soon as possible for a visit in 2 week(s).    Specialty: Obstetrics and Gynecology Why: Incision check Contact information: [address] [place] [address] [phone]                Please schedule this patient for a In person postpartum visit in 6 weeks with the following provider: MD.  Delivery mode:  C-Section, Low Transverse  Anticipated Birth Control:  Unsure   02/24/2021 Carlisle Cater, MD
